# Patient Record
Sex: Male | Born: 1985 | Race: White | Hispanic: No | Marital: Single | Smoking: Never smoker
Health system: Southern US, Community
[De-identification: ages and names within clinical notes are randomized; demographics above are authoritative.]

## PROBLEM LIST (undated history)

## (undated) DIAGNOSIS — F411 Generalized anxiety disorder: Secondary | ICD-10-CM

## (undated) DIAGNOSIS — B1920 Unspecified viral hepatitis C without hepatic coma: Secondary | ICD-10-CM

## (undated) DIAGNOSIS — F909 Attention-deficit hyperactivity disorder, unspecified type: Secondary | ICD-10-CM

## (undated) DIAGNOSIS — F1120 Opioid dependence, uncomplicated: Secondary | ICD-10-CM

## (undated) DIAGNOSIS — F1190 Opioid use, unspecified, uncomplicated: Secondary | ICD-10-CM

---

## 2009-09-22 ENCOUNTER — Emergency Department (HOSPITAL_COMMUNITY)
Admission: EM | Admit: 2009-09-22 | Discharge: 2009-09-22 | Payer: Self-pay | Source: Home / Self Care | Admitting: Emergency Medicine

## 2010-07-14 LAB — DIFFERENTIAL
Basophils Absolute: 0 10*3/uL (ref 0.0–0.1)
Eosinophils Absolute: 0.1 10*3/uL (ref 0.0–0.7)
Eosinophils Relative: 1 % (ref 0–5)
Lymphs Abs: 1 10*3/uL (ref 0.7–4.0)
Monocytes Absolute: 0.5 10*3/uL (ref 0.1–1.0)
Neutro Abs: 5.2 10*3/uL (ref 1.7–7.7)
Neutrophils Relative %: 76 % (ref 43–77)

## 2010-07-14 LAB — PROTIME-INR: INR: 1.01 (ref 0.00–1.49)

## 2010-07-14 LAB — POCT I-STAT, CHEM 8
Calcium, Ion: 1.12 mmol/L (ref 1.12–1.32)
Creatinine, Ser: 0.8 mg/dL (ref 0.4–1.5)
HCT: 40 % (ref 39.0–52.0)
Hemoglobin: 13.6 g/dL (ref 13.0–17.0)
Sodium: 140 mEq/L (ref 135–145)

## 2010-07-14 LAB — URINALYSIS, ROUTINE W REFLEX MICROSCOPIC
Bilirubin Urine: NEGATIVE
Specific Gravity, Urine: >1.046 — ABNORMAL HIGH (ref 1.005–1.030)
Urobilinogen, UA: 1 mg/dL (ref 0.0–1.0)

## 2010-07-14 LAB — SAMPLE TO BLOOD BANK

## 2010-07-14 LAB — RAPID URINE DRUG SCREEN, HOSP PERFORMED
Barbiturates: NOT DETECTED
Benzodiazepines: POSITIVE — AB

## 2010-07-14 LAB — CBC: WBC: 6.8 10*3/uL (ref 4.0–10.5)

## 2014-11-07 ENCOUNTER — Emergency Department (HOSPITAL_COMMUNITY)
Admission: EM | Admit: 2014-11-07 | Discharge: 2014-11-07 | Disposition: A | Discharge status: Home / Self Care | Payer: No Typology Code available for payment source | Attending: Emergency Medicine | Admitting: Emergency Medicine

## 2014-11-07 ENCOUNTER — Encounter (HOSPITAL_COMMUNITY): Payer: Self-pay | Admitting: Family Medicine

## 2014-11-07 DIAGNOSIS — Z96649 Presence of unspecified artificial hip joint: Secondary | ICD-10-CM | POA: Diagnosis not present

## 2014-11-07 DIAGNOSIS — Z760 Encounter for issue of repeat prescription: Secondary | ICD-10-CM | POA: Diagnosis not present

## 2014-11-07 MED ORDER — BUPRENORPHINE HCL 8 MG SL SUBL
8.0000 mg | SUBLINGUAL_TABLET | Freq: Once | SUBLINGUAL | Status: AC
  Administered 2014-11-07: 8 mg via SUBLINGUAL
  Filled 2014-11-07: qty 1

## 2014-11-07 NOTE — ED Notes (Signed)
Pt here for daily dose of sobaxin. sts he went to clinic and they were closed early when he went today and they open back up tomorrow.

## 2014-11-07 NOTE — Discharge Instructions (Signed)
Please follow up with your clinic tomorrow.

## 2014-11-07 NOTE — ED Provider Notes (Signed)
CSN: [id number]     Arrival date & time 11/07/14  1156 History  This chart was scribed for non-physician practitioner, Lottie Musselatyana A Paulena Servais, PA-C, working with Purvis SheffieldForrest Harrison, MD by Charline BillsEssence Howell, ED Scribe. This patient was seen in room TR05C/TR05C and the patient's care was started at 1:02 PM.   Chief Complaint  Patient presents with  . med refill    The history is provided by the patient. No language interpreter was used.   HPI Comments: Angel Scott is a 29 y.o. male who presents to the Emergency Department for a medication refill of Suboxone. Pt states that he went to the Methadone clinic today for a refill but was informed that the clinic closed early for a training. He also states that his counselor is out for 6 weeks due to a hip replacement so he was unable to get a prescription. Pt was referred to the ED by the director of the clinic for a refill. Pt states that he goes to the clinic weekly to have medication filled for a week and has not missed a dose in years. His last dose was yesterday. Pt plans to return to the clinic tomorrow.   History reviewed. No pertinent past medical history. History reviewed. No pertinent past surgical history. History reviewed. No pertinent family history. History  Substance Use Topics  . Smoking status: Never Smoker   . Smokeless tobacco: Not on file  . Alcohol Use: No    Review of Systems  All other systems reviewed and are negative.  Allergies  Review of patient's allergies indicates no known allergies.  Home Medications   Prior to Admission medications   Not on File   BP 116/74 mmHg  Pulse 83  Temp(Src) 98 F (36.7 C)  Resp 18  Ht 6\' 2"  (1.88 m)  Wt 180 lb (81.647 kg)  BMI 23.10 kg/m2  SpO2 100% Physical Exam  Constitutional: He is oriented to person, place, and time. He appears well-developed and well-nourished. No distress.  HENT:  Head: Normocephalic and atraumatic.  Eyes: Conjunctivae and EOM are normal.  Neck: Neck  supple. No tracheal deviation present.  Cardiovascular: Normal rate.   Pulmonary/Chest: Effort normal. No respiratory distress.  Musculoskeletal: Normal range of motion.  Neurological: He is alert and oriented to person, place, and time.  Skin: Skin is warm and dry.  Psychiatric: He has a normal mood and affect. His behavior is normal.  Nursing note and vitals reviewed.  ED Course  Procedures (including critical care time) DIAGNOSTIC STUDIES: Oxygen Saturation is 100% on RA, normal by my interpretation.    COORDINATION OF CARE: 1:05 PM-Discussed treatment plan which includes suboxone with pt at bedside and pt agreed to plan.   Labs Review Labs Reviewed - No data to display  Imaging Review No results found.   EKG Interpretation None      MDM   Final diagnoses:  Medication refill    Pt with no complaints, requesting does of Suboxone. Pt will follow up tomorrow morning. 8mg  given in ED. Pt discharged home in stable condition.   Filed Vitals:   11/07/14 1217 11/07/14 1407  BP: 116/74 121/85  Pulse: 83 77  Temp: 98 F (36.7 C) 98.3 F (36.8 C)  TempSrc:  Oral  Resp: 18 18  Height: 6\' 2"  (1.88 m)   Weight: 180 lb (81.647 kg)   SpO2: 100% 100%    I personally performed the services described in this documentation, which was scribed in my presence. The recorded  information has been reviewed and is accurate.   Jaynie Crumble, PA-C 11/07/14 1657  Purvis Sheffield, MD 11/09/14 [phone]

## 2014-11-07 NOTE — ED Notes (Signed)
States he is here for medication refill from the Methadone clinic because his counselor is out with hip surgery. Security at the clinic stated he should come to the ED and get medication filled. Pt reports he goes once a week to have medication filled.

## 2015-08-12 ENCOUNTER — Other Ambulatory Visit: Payer: No Typology Code available for payment source

## 2015-08-12 DIAGNOSIS — B182 Chronic viral hepatitis C: Secondary | ICD-10-CM

## 2015-08-12 LAB — CBC WITH DIFFERENTIAL/PLATELET
BASOS PCT: 0 %
Basophils Absolute: 0 cells/uL (ref 0–200)
EOS PCT: 2 %
Eosinophils Absolute: 82 cells/uL (ref 15–500)
HCT: 40 % (ref 38.5–50.0)
Hemoglobin: 13.8 g/dL (ref 13.2–17.1)
LYMPHS ABS: 1066 {cells}/uL (ref 850–3900)
LYMPHS PCT: 26 %
MCH: 32.1 pg (ref 27.0–33.0)
MCHC: 34.5 g/dL (ref 32.0–36.0)
MCV: 93 fL (ref 80.0–100.0)
MONO ABS: 328 {cells}/uL (ref 200–950)
MPV: 10.3 fL (ref 7.5–12.5)
Monocytes Relative: 8 %
NEUTROS PCT: 64 %
Neutro Abs: 2624 cells/uL (ref 1500–7800)
Platelets: 271 10*3/uL (ref 140–400)
RBC: 4.3 MIL/uL (ref 4.20–5.80)
RDW: 13.4 % (ref 11.0–15.0)
WBC: 4.1 10*3/uL (ref 3.8–10.8)

## 2015-08-12 LAB — COMPREHENSIVE METABOLIC PANEL
ALK PHOS: 58 U/L (ref 40–115)
ALT: 55 U/L — AB (ref 9–46)
AST: 45 U/L — AB (ref 10–40)
Albumin: 4 g/dL (ref 3.6–5.1)
BILIRUBIN TOTAL: 0.8 mg/dL (ref 0.2–1.2)
BUN: 8 mg/dL (ref 7–25)
CO2: 26 mmol/L (ref 20–31)
CREATININE: 0.75 mg/dL (ref 0.60–1.35)
Calcium: 9.1 mg/dL (ref 8.6–10.3)
Chloride: 102 mmol/L (ref 98–110)
GLUCOSE: 96 mg/dL (ref 65–99)
Potassium: 4 mmol/L (ref 3.5–5.3)
SODIUM: 139 mmol/L (ref 135–146)
Total Protein: 7.4 g/dL (ref 6.1–8.1)

## 2015-08-12 LAB — PROTIME-INR
INR: 1.04 (ref <1.50)
Prothrombin Time: 13.7 seconds (ref 11.6–15.2)

## 2015-08-12 LAB — HIV ANTIBODY (ROUTINE TESTING W REFLEX): HIV 1&2 Ab, 4th Generation: NONREACTIVE

## 2015-08-12 LAB — HEPATITIS B CORE ANTIBODY, TOTAL: Hep B Core Total Ab: NONREACTIVE

## 2015-08-12 LAB — IRON: Iron: 269 ug/dL — ABNORMAL HIGH (ref 50–180)

## 2015-08-12 LAB — HEPATITIS A ANTIBODY, TOTAL: Hep A Total Ab: NONREACTIVE

## 2015-08-12 LAB — HEPATITIS B SURFACE ANTIBODY,QUALITATIVE: HEP B S AB: POSITIVE — AB

## 2015-08-12 LAB — HEPATITIS B SURFACE ANTIGEN: Hepatitis B Surface Ag: NEGATIVE

## 2015-08-13 LAB — ANA: ANA: NEGATIVE

## 2015-08-18 LAB — HCV RNA,LIPA RFLX NS5A DRUG RESIST

## 2015-08-18 LAB — HCV RNA, QUANT REAL-TIME PCR W/REFLEX
HCV RNA, PCR, QN (Log): 5.23 LogIU/mL — ABNORMAL HIGH
HCV RNA, PCR, QN: 169000 IU/mL — ABNORMAL HIGH

## 2015-09-11 ENCOUNTER — Ambulatory Visit (INDEPENDENT_AMBULATORY_CARE_PROVIDER_SITE_OTHER): Payer: BLUE CROSS/BLUE SHIELD | Admitting: Internal Medicine

## 2015-09-11 ENCOUNTER — Encounter: Payer: Self-pay | Admitting: Internal Medicine

## 2015-09-11 VITALS — BP 108/71 | HR 96 | Temp 98.3°F | Wt 184.0 lb

## 2015-09-11 DIAGNOSIS — B182 Chronic viral hepatitis C: Secondary | ICD-10-CM | POA: Diagnosis not present

## 2015-09-11 DIAGNOSIS — F411 Generalized anxiety disorder: Secondary | ICD-10-CM

## 2015-09-11 DIAGNOSIS — F329 Major depressive disorder, single episode, unspecified: Secondary | ICD-10-CM

## 2015-09-11 DIAGNOSIS — F909 Attention-deficit hyperactivity disorder, unspecified type: Secondary | ICD-10-CM | POA: Diagnosis not present

## 2015-09-11 DIAGNOSIS — F32A Depression, unspecified: Secondary | ICD-10-CM

## 2015-09-11 MED ORDER — SOFOSBUVIR-VELPATASVIR 400-100 MG PO TABS: 1.0000 | ORAL_TABLET | Freq: Every day | ORAL | Status: DC

## 2015-09-11 NOTE — Patient Instructions (Signed)
Date 09/11/2015  Dear Mr. Angel Scott, As discussed in the ID Clinic, your hepatitis C therapy will include the following medications:          Epclusa (sofosbuvir 400 mg/velpatasvir 100 mg) tablet:           Take 1 tablet by mouth once daily   Please note that ALL MEDICATIONS WILL START ON THE SAME DATE for a total of 12 weeks. ---------------------------------------------------------------- Your HCV Treatment Start Date: TBA   Your HCV genotype:  3    Liver Fibrosis: TBD    ---------------------------------------------------------------- YOUR PHARMACY CONTACT:   [facility] [address] Phone: [phone] Hours: Monday to Friday 7:30 am to 6:00 pm   Please always contact your pharmacy at least 3-4 business days before you run out of medications to ensure your next month's medication is ready or 1 week prior to running out if you receive it by mail.  Remember, each prescription is for 28 days. ---------------------------------------------------------------- GENERAL NOTES REGARDING YOUR HEPATITIS C MEDICATION:  SOFOSBUVIR/VELPATASVIR (Epclusa): - can be taken with our without food  - Acid reducing agents such as H2 blockers (ie. Pepcid (famotidine), Zantac (ranitidine), Tagamet (cimetidine), Axid (nizatidine) and proton pump inhibitors (ie. Prilosec (omeprazole), Protonix (pantoprazole), Nexium (esomeprazole), or Aciphex (rabeprazole)) can decrease effectiveness of Epclusa. Do not take until you have discussed with a health care provider.    -Antacids that contain magnesium and/or aluminum hydroxide (ie. Milk of Magensia, Rolaids, Gaviscon, Maalox, Mylanta, an dArthritis Pain Formula)can reduce absorption of Epclusa, so take them at least 4 hours after Epclusa.  -Calcium carbonate (calcium supplements or antacids such as Tums, Caltrate, Os-Cal)needs to be taken at least 4 hours hours after Epclusa.  -St. John's wort  or any products that contain St. John's wort like some herbal supplements  Please inform the office prior to starting any of these medications.  - The common side effects associated with Epclusa include:      1. Fatigue      2. Headache      3. Nausea      4. Diarrhea      5. Insomnia  Please note that this only lists the most common side effects and is NOT a comprehensive list of the potential side effects of these medications. For more information, please review the drug information sheets that come with your medication package from the pharmacy.  ---------------------------------------------------------------- GENERAL HELPFUL HINTS ON HCV THERAPY: 1. Stay well-hydrated. 2. Notify the ID Clinic of any changes in your other over-the-counter/herbal or prescription medications. 3. If you miss a dose of your medication, take the missed dose as soon as you remember. Return to your regular time/dose schedule the next day.  4.  Do not stop taking your medications without first talking with your healthcare provider. 5.  You may take Tylenol (acetaminophen), as long as the dose is less than 2000 mg (OR no more than 4 tablets of the Tylenol Extra Strengths 500mg  tablet) in 24 hours. 6.  You will see our pharmacist-specialist within the first 2 weeks of starting your medication. 7.  You will need to obtain routine labs around week 4 and12 weeks after starting and then 3 to 6 months after finishing Epclusa.    Staci RighterOMER, Kendell Sagraves, MD  [facility] [address] [place], [place]  [id number] [phone]

## 2015-09-13 DIAGNOSIS — F329 Major depressive disorder, single episode, unspecified: Secondary | ICD-10-CM | POA: Insufficient documentation

## 2015-09-13 DIAGNOSIS — F32A Depression, unspecified: Secondary | ICD-10-CM | POA: Insufficient documentation

## 2015-09-13 DIAGNOSIS — F411 Generalized anxiety disorder: Secondary | ICD-10-CM | POA: Insufficient documentation

## 2015-09-13 DIAGNOSIS — F909 Attention-deficit hyperactivity disorder, unspecified type: Secondary | ICD-10-CM | POA: Insufficient documentation

## 2015-09-13 NOTE — Progress Notes (Signed)
[facility]   CC: consideration for treatment for chronic hepatitis C  HPI:  +Angel Scott is a 30 y.o. male who presents for initial evaluation and management of chronic hepatitis C.  Patient tested positive over 5 years ago in [place], but did not ever follow through or was ever offered treatment. Hepatitis C-associated risk factors present are: history of drug abuse. Patient denies IVDU. Patient has had other studies performed. Results: hepatitis C RNA by PCR, result: positive. Patient has not had prior treatment for Hepatitis C. Patient does not have a past history of liver disease. Patient does not have a family history of liver disease. Patient does not  have associated signs or symptoms related to liver disease.  Labs reviewed and confirm chronic hepatitis C with a positive viral load.   Never previously treated, seems he was just told to wait as many providers were at the time waiting for the now available oral options.     Patient does not have documented immunity to Hepatitis A. Patient does have documented immunity to Hepatitis B.    Review of Systems:   Constitutional: negative for fatigue and malaise Gastrointestinal: negative for diarrhea Musculoskeletal: negative for myalgias and arthralgias All other systems reviewed and are negative      No past medical history on file.  Prior to Admission medications   Medication Sig Start Date End Date Taking? Authorizing Provider  amphetamine-dextroamphetamine (ADDERALL) 20 MG tablet Take 40 mg by mouth daily.   Yes Historical Provider, MD  Sofosbuvir-Velpatasvir (EPCLUSA) 400-100 MG TABS Take 1 tablet by mouth daily. 09/11/15   Gardiner Barefoot, MD    No Known Allergies  Social History  Substance Use Topics  . Smoking status: Never Smoker   . Smokeless tobacco: Never Used  . Alcohol Use: No    FMHx: possible aunt with cirrhosis   Objective:  Constitutional: in no apparent distress and alert,    Filed Vitals:   09/11/15 1458  BP: 108/71  Pulse: 96  Temp: 98.3 F (36.8 C)   Eyes: anicteric Cardiovascular: Cor RRR Respiratory: CTA B; normal respiratory effort Gastrointestinal: Bowel sounds are normal, liver is not enlarged, spleen is not enlarged Musculoskeletal: no pedal edema noted Skin: negatives: no rash; no porphyria cutanea tarda Lymphatic: no cervical lymphadenopathy   Laboratory Genotype: No results found for: HCVGENOTYPE HCV viral load: No results found for: HCVQUANT Lab Results  Component Value Date   WBC 4.1 08/12/2015   HGB 13.8 08/12/2015   HCT 40.0 08/12/2015   MCV 93.0 08/12/2015   PLT 271 08/12/2015    Lab Results  Component Value Date   CREATININE 0.75 08/12/2015   BUN 8 08/12/2015   NA 139 08/12/2015   K 4.0 08/12/2015   CL 102 08/12/2015   CO2 26 08/12/2015    Lab Results  Component Value Date   ALT 55* 08/12/2015   AST 45* 08/12/2015   ALKPHOS 58 08/12/2015     Labs and history reviewed and show CHILD-PUGH A  5-6 points: Child class A 7-9 points: Child class B 10-15 points: Child class C  Lab Results  Component Value Date   INR 1.04 08/12/2015   BILITOT 0.8 08/12/2015   ALBUMIN 4.0 08/12/2015     Assessment: New Patient with Chronic Hepatitis C genotype 3a, untreated.  I discussed with the patient the lab findings that confirm chronic hepatitis C as well as the natural history and progression of disease including about 30% of  people who develop cirrhosis of the liver if left untreated and once cirrhosis is established there is a 2-7% risk per year of liver cancer and liver failure.  I discussed the importance of treatment and benefits in reducing the risk, even if significant liver fibrosis exists.  Also with resultant transaminitis.   Plan: 1) Patient counseled extensively on limiting acetaminophen to no more than 2 grams daily, avoidance of alcohol. 2) Transmission discussed with patient including sexual transmission,  sharing razors and toothbrush.   3) Will need referral to gastroenterology if concern for cirrhosis 4) Will need referral for substance abuse counseling: No.; Further work up to include urine drug screen  No. 5) Will prescribe Epclusa for 12 weeks 6) Hepatitis A vaccine Yes.  will offer next visit.   7) Hepatitis B vaccine No. 8) Pneumovax vaccine if concern for cirrhosis 9) Further work up to include liver staging with elastography 10) will follow up after starting medication

## 2015-09-25 ENCOUNTER — Ambulatory Visit (HOSPITAL_COMMUNITY): Payer: BLUE CROSS/BLUE SHIELD

## 2015-10-22 ENCOUNTER — Ambulatory Visit (HOSPITAL_COMMUNITY): Payer: BLUE CROSS/BLUE SHIELD

## 2015-10-31 ENCOUNTER — Ambulatory Visit (HOSPITAL_COMMUNITY): Payer: BLUE CROSS/BLUE SHIELD

## 2015-11-20 ENCOUNTER — Ambulatory Visit (HOSPITAL_COMMUNITY)
Admission: RE | Admit: 2015-11-20 | Discharge: 2015-11-20 | Disposition: A | Discharge status: Home / Self Care | Payer: BLUE CROSS/BLUE SHIELD | Source: Ambulatory Visit | Attending: Internal Medicine | Admitting: Internal Medicine

## 2015-11-20 DIAGNOSIS — B182 Chronic viral hepatitis C: Secondary | ICD-10-CM

## 2015-12-12 ENCOUNTER — Ambulatory Visit (HOSPITAL_COMMUNITY): Payer: BLUE CROSS/BLUE SHIELD

## 2016-02-11 ENCOUNTER — Ambulatory Visit (HOSPITAL_COMMUNITY)
Admission: RE | Admit: 2016-02-11 | Discharge: 2016-02-11 | Disposition: A | Discharge status: Home / Self Care | Payer: BLUE CROSS/BLUE SHIELD | Source: Ambulatory Visit | Attending: Internal Medicine | Admitting: Internal Medicine

## 2016-02-11 DIAGNOSIS — B182 Chronic viral hepatitis C: Secondary | ICD-10-CM | POA: Insufficient documentation

## 2016-02-12 ENCOUNTER — Other Ambulatory Visit: Payer: Self-pay | Admitting: Pharmacist

## 2016-02-12 MED ORDER — SOFOSBUVIR-VELPATASVIR 400-100 MG PO TABS: 1.0000 | ORAL_TABLET | Freq: Every day | ORAL | 2 refills | Status: DC

## 2016-02-24 ENCOUNTER — Encounter: Payer: Self-pay | Admitting: Pharmacy Technician

## 2016-03-17 ENCOUNTER — Ambulatory Visit: Payer: BLUE CROSS/BLUE SHIELD

## 2016-06-02 ENCOUNTER — Ambulatory Visit (INDEPENDENT_AMBULATORY_CARE_PROVIDER_SITE_OTHER): Payer: BLUE CROSS/BLUE SHIELD | Admitting: Pharmacist Clinician (PhC)/ Clinical Pharmacy Specialist

## 2016-06-02 DIAGNOSIS — B182 Chronic viral hepatitis C: Secondary | ICD-10-CM

## 2016-06-02 NOTE — Progress Notes (Signed)
HPI: Angel Scott is a 31 y.o. male who has never f/u with us after starting his hep C meds.   No results found for: HCVGENOTYPE, HEPCGENOTYPE  Allergies: No Known Allergies  Vitals:    Past Medical History: No past medical history on file.  Social History: Social History   Social History  . Marital status: Single    Spouse name: N/A  . Number of children: N/A  . Years of education: N/A   Social History Main Topics  . Smoking status: Never Smoker  . Smokeless tobacco: Never Used  . Alcohol use No  . Drug use: No  . Sexual activity: Not on file   Other Topics Concern  . Not on file   Social History Narrative  . No narrative on file    Labs: Hep B S Ab (no units)  Date Value  08/12/2015 POS (A)   Hepatitis B Surface Ag (no units)  Date Value  08/12/2015 NEGATIVE    No results found for: HCVGENOTYPE, HEPCGENOTYPE  No flowsheet data found.  AST (U/L)  Date Value  08/12/2015 45 (H)   ALT (U/L)  Date Value  08/12/2015 55 (H)   INR (no units)  Date Value  08/12/2015 1.04  09/22/2009 1.01    CrCl: CrCl cannot be calculated (Patient's most recent lab result is older than the maximum 21 days allowed.).  Fibrosis Score: F2/3 as assessed by [facility]   Child-Pugh Score: Class A  Previous Treatment Regimen: None  Assessment: Angel Scott started his Epclusa on 10/31 for this geno 3 hepatitis C. Not sure why he fell through the loop. He hasn't had any side effects from the drug. He only took one dose late on the day that his daughter was born. He finished about 1.5 weeks ago. Therefore, we are going to do his EOT VL today. Since he is not cirrhotic, Rx will bring him back for the cure visit. He is currently on subutex for the opiate problem.   Recommendations: EOT VL today SVR12 in 3 months Appt are made to f/u with pharmacy  [facility] LL[facility] Lizette Pazos, Pharm.D., BCPS, AAHIVP Clinical Infectious Disease Pharmacist [facility] 06/02/2016, 4:04  PM

## 2016-06-02 NOTE — Patient Instructions (Addendum)
F/u with lab in 3 mo F/u with me a week later for the cure visit

## 2016-06-04 ENCOUNTER — Other Ambulatory Visit: Payer: BLUE CROSS/BLUE SHIELD

## 2016-06-04 LAB — COMPLETE METABOLIC PANEL WITH GFR
ALBUMIN: 4.7 g/dL (ref 3.6–5.1)
ALK PHOS: 73 U/L (ref 40–115)
ALT: 17 U/L (ref 9–46)
AST: 24 U/L (ref 10–40)
BILIRUBIN TOTAL: 0.5 mg/dL (ref 0.2–1.2)
BUN: 10 mg/dL (ref 7–25)
CO2: 27 mmol/L (ref 20–31)
CREATININE: 0.87 mg/dL (ref 0.60–1.35)
Calcium: 9.8 mg/dL (ref 8.6–10.3)
Chloride: 99 mmol/L (ref 98–110)
GFR, Est African American: >89 mL/min (ref >=60)
GFR, Est Non African American: >89 mL/min (ref >=60)
GLUCOSE: 128 mg/dL — AB (ref 65–99)
Potassium: 3.8 mmol/L (ref 3.5–5.3)
SODIUM: 140 mmol/L (ref 135–146)
TOTAL PROTEIN: 8.8 g/dL — AB (ref 6.1–8.1)

## 2016-06-08 LAB — HEPATITIS C RNA QUANTITATIVE
HCV Quantitative Log: <1.18 Log IU/mL
HCV Quantitative: <15 IU/mL

## 2016-06-10 ENCOUNTER — Telehealth: Payer: Self-pay | Admitting: Pharmacist Clinician (PhC)/ Clinical Pharmacy Specialist

## 2016-06-10 NOTE — Telephone Encounter (Signed)
Angel Scott had a visit with me recently where we did a hep VL for his EOT. Told him that it has came back undetectable so far which is good news. [id number] then see me.

## 2016-08-25 ENCOUNTER — Other Ambulatory Visit: Payer: BLUE CROSS/BLUE SHIELD

## 2016-08-25 DIAGNOSIS — B182 Chronic viral hepatitis C: Secondary | ICD-10-CM

## 2016-08-27 LAB — HEPATITIS C RNA QUANTITATIVE
HCV Quantitative Log: <1.18 Log IU/mL
HCV Quantitative: <15 IU/mL

## 2016-08-29 ENCOUNTER — Encounter: Payer: Self-pay | Admitting: Internal Medicine

## 2016-08-31 ENCOUNTER — Ambulatory Visit: Payer: BLUE CROSS/BLUE SHIELD

## 2016-09-01 ENCOUNTER — Encounter: Payer: Self-pay | Admitting: Pharmacist Clinician (PhC)/ Clinical Pharmacy Specialist

## 2018-03-14 ENCOUNTER — Other Ambulatory Visit: Payer: Self-pay

## 2018-03-14 ENCOUNTER — Emergency Department (HOSPITAL_COMMUNITY): Payer: BLUE CROSS/BLUE SHIELD

## 2018-03-14 ENCOUNTER — Encounter (HOSPITAL_COMMUNITY): Payer: Self-pay | Admitting: *Deleted

## 2018-03-14 ENCOUNTER — Emergency Department (HOSPITAL_COMMUNITY)
Admission: EM | Admit: 2018-03-14 | Discharge: 2018-03-14 | Disposition: A | Discharge status: Home / Self Care | Payer: BLUE CROSS/BLUE SHIELD | Attending: Emergency Medicine | Admitting: Emergency Medicine

## 2018-03-14 DIAGNOSIS — S63287A Dislocation of proximal interphalangeal joint of left little finger, initial encounter: Secondary | ICD-10-CM | POA: Insufficient documentation

## 2018-03-14 DIAGNOSIS — Y999 Unspecified external cause status: Secondary | ICD-10-CM | POA: Diagnosis not present

## 2018-03-14 DIAGNOSIS — Z79899 Other long term (current) drug therapy: Secondary | ICD-10-CM | POA: Insufficient documentation

## 2018-03-14 DIAGNOSIS — F909 Attention-deficit hyperactivity disorder, unspecified type: Secondary | ICD-10-CM | POA: Insufficient documentation

## 2018-03-14 DIAGNOSIS — W231XXA Caught, crushed, jammed, or pinched between stationary objects, initial encounter: Secondary | ICD-10-CM | POA: Diagnosis not present

## 2018-03-14 DIAGNOSIS — S62609A Fracture of unspecified phalanx of unspecified finger, initial encounter for closed fracture: Secondary | ICD-10-CM

## 2018-03-14 DIAGNOSIS — Y929 Unspecified place or not applicable: Secondary | ICD-10-CM | POA: Diagnosis not present

## 2018-03-14 DIAGNOSIS — S6992XA Unspecified injury of left wrist, hand and finger(s), initial encounter: Secondary | ICD-10-CM | POA: Diagnosis present

## 2018-03-14 DIAGNOSIS — Y9389 Activity, other specified: Secondary | ICD-10-CM | POA: Diagnosis not present

## 2018-03-14 MED ORDER — LIDOCAINE HCL (PF) 1 % IJ SOLN
30.0000 mL | Freq: Once | INTRAMUSCULAR | Status: AC
  Administered 2018-03-14: 30 mL
  Filled 2018-03-14: qty 30

## 2018-03-14 NOTE — ED Triage Notes (Signed)
The pt has injured his lt little finger while moving earlier tonight  Painful and sl swollen

## 2018-03-14 NOTE — ED Provider Notes (Signed)
[facility] Provider Note   CSN: [id number] Arrival date & time: 03/14/18  0125     History   Chief Complaint Chief Complaint  Patient presents with  . Finger Injury    HPI Angel Scott is a 32 y.o. male with history of a.  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  Sudden onset immediately PTA.  Patient states that he was moving around furniture when he excellently dropped a piece of furniture and got his pinky finger stuck in between 2 pieces of furniture.  He had sudden, severe pain in his left pinky finger with audible pop.  Has applied ice.  No other interventions.  Pain is aggravated with any movement or palpation.  States his finger feels stuck and he cannot really flex it.  No distal paresthesias or loss of sensation.  He is right-hand dominant.  HPI  History reviewed. No pertinent past medical history.  Patient Active Problem List   Diagnosis Date Noted  . Attention deficit hyperactivity disorder (ADHD) 09/13/2015  . Depression 09/13/2015  . Generalized anxiety disorder 09/13/2015  . Chronic hepatitis C without hepatic coma (HCC) 09/11/2015    History reviewed. No pertinent surgical history.      Home Medications    Prior to Admission medications   Medication Sig Start Date End Date Taking? Authorizing Provider  amphetamine-dextroamphetamine (ADDERALL) 20 MG tablet Take 20 mg by mouth 2 (two) times daily.     [provider]  buprenorphine (SUBUTEX) 2 MG SUBL SL tablet Place 8 mg under the tongue daily.    [provider]  clonazePAM (KLONOPIN) 0.5 MG tablet Take 2 mg by mouth 2 (two) times daily as needed for anxiety.    [provider]  Sofosbuvir-Velpatasvir (EPCLUSA) 400-100 MG TABS Take 1 tablet by mouth daily. Patient not taking: Reported on 06/02/2016 02/12/16   Gardiner Barefootomer, Robert W, MD    Family  History No family history on file.  Social History Social History   Tobacco Use  . Smoking status: Never Smoker  . Smokeless tobacco: Never Used  Substance Use Topics  . Alcohol use: No    Alcohol/week: 0.0 standard drinks  . Drug use: No     Allergies   Patient has no known allergies.   Review of Systems Review of Systems  Musculoskeletal: Positive for arthralgias and joint swelling.  All other systems reviewed and are negative.    Physical Exam Updated Vital Signs BP 110/75 (BP Location: Right Arm)   Pulse (!) 107   Temp 98.1 F (36.7 C) (Oral)   Resp 18   Ht 6\' 2"  (1.88 m)   Wt 81.6 kg   SpO2 100%   BMI 23.11 kg/m   Physical Exam  Constitutional: He is oriented to person, place, and time. He appears well-developed and well-nourished.  Non-toxic appearance.  HENT:  Head: Normocephalic.  Right Ear: External ear normal.  Left Ear: External ear normal.  Nose: Nose normal.  Eyes: Conjunctivae and EOM are normal.  Neck: Full passive range of motion without pain.  Cardiovascular: Normal rate.  Brisk cap refill to left finger tips. 1+ radial pulses bilaterally  Pulmonary/Chest: Effort normal. No tachypnea. No respiratory distress.  Musculoskeletal: Normal range of motion. He exhibits tenderness and deformity.  Obvious deformity at DIP of left 5th finger, hyperextension at DIP with mild edema. Focal tenderness. Full flexion at MCP but unable to F/E at  DIP or PIP.  Full painless movements of other digits, wrist.   Neurological: He is alert and oriented to person, place, and time.  Sensation to light touch and sharp prick intact in left 5th finger pad   Skin: Skin is warm and dry. Capillary refill takes less than 2 seconds.  Psychiatric: His behavior is normal. Thought content normal.     ED Treatments / Results  Labs (all labs ordered are listed, but only abnormal results are displayed) Labs Reviewed - No data to display  EKG None  Radiology Dg Finger  Little Left  Result Date: 03/14/2018 CLINICAL DATA:  Postreduction left fifth finger EXAM: LEFT LITTLE FINGER 2+V COMPARISON:  03/14/2018 FINDINGS: Interval relocation of the proximal interphalangeal joint with anatomic alignment postreduction. There again is a displaced volar plate fracture fragment. Diffuse soft tissue swelling. IMPRESSION: Anatomic alignment postreduction. Displaced volar plate fracture fragment off of the middle phalanx. Electronically Signed   By: Burman Nieves M.D.   On: 03/14/2018 02:58   Dg Finger Little Left  Result Date: 03/14/2018 CLINICAL DATA:  Pain and swelling of the left fifth finger after injury tonight. EXAM: LEFT LITTLE FINGER 2+V COMPARISON:  None. FINDINGS: There is complete posterior dislocation of the middle phalanx of the left fifth finger with respect to the proximal phalanx. There is a small volar plate fragment anteriorly consistent with displaced volar plate fracture and ligamentous injury. Distal interphalangeal joint appears intact. Soft tissue swelling. No focal bone lesion or bone destruction. IMPRESSION: Complete posterior dislocation of the middle phalanx of the left fifth finger with displaced volar plate fracture fragment. Electronically Signed   By: Burman Nieves M.D.   On: 03/14/2018 02:05    Procedures .Nerve Block Date/Time: 03/14/2018 3:04 AM Performed by: Liberty Handy, PA-C Authorized by: Liberty Handy, PA-C   Consent:    Consent obtained:  Verbal   Consent given by:  Patient   Risks discussed:  Infection, nerve damage, swelling, unsuccessful block, pain and bleeding   Alternatives discussed:  Alternative treatment (reduction of dislocation without anesthesia) Universal protocol:    Test results available and properly labeled: yes     Imaging studies available: yes     Patient identity confirmed:  Verbally with patient Indications:    Indications:  Pain relief and procedural anesthesia Pre-procedure details:     Skin preparation:  Alcohol Skin anesthesia (see MAR for exact dosages):    Skin anesthesia method:  Local infiltration   Local anesthetic:  Lidocaine 1% w/o epi Procedure details (see MAR for exact dosages):    Block needle gauge:  25 G   Anesthetic injected:  Lidocaine 1% w/o epi   Injection procedure:  Anatomic landmarks identified, incremental injection, negative aspiration for blood, introduced needle and anatomic landmarks palpated   Paresthesia:  None Post-procedure details:    Dressing:  None   Outcome:  Anesthesia achieved   Patient tolerance of procedure:  Tolerated well, no immediate complications  Reduction of dislocation Date/Time: 03/14/2018 3:11 AM Performed by: Liberty Handy, PA-C Authorized by: Liberty Handy, PA-C  Consent: Verbal consent obtained. Risks and benefits: risks, benefits and alternatives were discussed Consent given by: patient Patient understanding: patient states understanding of the procedure being performed Test results: test results available and properly labeled Imaging studies: imaging studies available Patient identity confirmed: verbally with patient Time out: Immediately prior to procedure a "time out" was called to verify the correct patient, procedure, equipment, support staff and site/side marked as required.  Local anesthesia used: yes Anesthesia: digital block  Anesthesia: Local anesthesia used: yes Local Anesthetic: lidocaine 1% without epinephrine  Sedation: Patient sedated: no  Patient tolerance: Patient tolerated the procedure well with no immediate complications Comments: Patient able to flex to 90 degrees at DIP and PIP after procedure. Brisk cap refill distally.     (including critical care time)  Medications Ordered in ED Medications  lidocaine (PF) (XYLOCAINE) 1 % injection 30 mL (30 mLs Infiltration Given by Other 03/14/18 0210)     Initial Impression / Assessment and Plan / ED Course  I have reviewed the  triage vital signs and the nursing notes.  Pertinent labs & imaging results that were available during my care of the patient were reviewed by me and considered in my medical decision making (see chart for details).  Clinical Course as of Mar 14 314  Mon Mar 14, 2018  0224 IMPRESSION: Complete posterior dislocation of the middle phalanx of the left fifth finger with displaced volar plate fracture fragment.  DG Finger Little Left [CG]    Clinical Course User Index [CG] Liberty Handy, PA-C   32 year old here with traumatic fracture dislocation of the left PIP.  Extremity neurovascularly intact prior to reduction.  Digital block and reduction performed in the ER without immediate complications.  Post reduction x-rays confirm anatomical position and previously noted volar plate fracture fragment.  Patient was placed in a splint.  We will discharge with rice protocol, follow-up with hand.  Return precautions given.  Patient able to flex at DIP/PIP to 90 degrees after reduction.   Final Clinical Impressions(s) / ED Diagnoses   Final diagnoses:  Closed fracture dislocation of proximal interphalangeal (PIP) joint of finger, initial encounter    ED Discharge Orders    None       Liberty Handy, PA-C 03/14/18 0315    Gilda Crease, MD 03/16/18 [phone]

## 2018-03-14 NOTE — Discharge Instructions (Signed)
You were seen in the ER for left pinky pain.  You have a fracture dislocation injury.  Dislocation was reduced.  Wear your splint until you are reevaluated by hand surgery.  Follow-up within 7 to 10 days for repeat exam and further management.  Can take ibuprofen or acetaminophen for pain.  Ice.  Return for loss of sensation of the fingertip, purple discoloration or coolness to the finger.

## 2018-03-18 ENCOUNTER — Encounter (HOSPITAL_COMMUNITY): Payer: Self-pay

## 2018-03-18 ENCOUNTER — Emergency Department (HOSPITAL_COMMUNITY)
Admission: EM | Admit: 2018-03-18 | Discharge: 2018-03-18 | Disposition: A | Discharge status: Home / Self Care | Payer: BLUE CROSS/BLUE SHIELD | Attending: Emergency Medicine | Admitting: Emergency Medicine

## 2018-03-18 DIAGNOSIS — K0889 Other specified disorders of teeth and supporting structures: Secondary | ICD-10-CM | POA: Diagnosis present

## 2018-03-18 DIAGNOSIS — Z79899 Other long term (current) drug therapy: Secondary | ICD-10-CM | POA: Insufficient documentation

## 2018-03-18 MED ORDER — PENICILLIN V POTASSIUM 500 MG PO TABS: 500.0000 mg | ORAL_TABLET | Freq: Four times a day (QID) | ORAL | 0 refills | Status: DC

## 2018-03-18 MED ORDER — PENICILLIN V POTASSIUM 250 MG PO TABS
500.0000 mg | ORAL_TABLET | Freq: Once | ORAL | Status: AC
  Administered 2018-03-18: 500 mg via ORAL
  Filled 2018-03-18: qty 2

## 2018-03-18 NOTE — ED Provider Notes (Signed)
[facility] EMERGENCY DEPARTMENT Provider Note   CSN: [id number] Arrival date & time: 03/18/18  0058     History   Chief Complaint No chief complaint on file.   HPI Angel Scott is a 32 y.o. male.  Patient presents to the emergency department with a dental complaint. Symptoms began a few days ago. The patient has tried to alleviate pain with benzocaine.  Pain rated at a 10/10, characterized as throbbing in nature and located left lower 2nd to rear molar. Patient denies fever, night sweats, chills, difficulty swallowing or opening mouth, SOB, nuchal rigidity or decreased ROM of neck.  Patient does not have a dentist and requests a resource guide at discharge.   The history is provided by the patient. No language interpreter was used.    History reviewed. No pertinent past medical history.  Patient Active Problem List   Diagnosis Date Noted  . Attention deficit hyperactivity disorder (ADHD) 09/13/2015  . Depression 09/13/2015  . Generalized anxiety disorder 09/13/2015  . Chronic hepatitis C without hepatic coma (HCC) 09/11/2015    History reviewed. No pertinent surgical history.      Home Medications    Prior to Admission medications   Medication Sig Start Date End Date Taking? Authorizing Provider  amphetamine-dextroamphetamine (ADDERALL) 20 MG tablet Take 20 mg by mouth 2 (two) times daily.     [provider]  buprenorphine (SUBUTEX) 2 MG SUBL SL tablet Place 8 mg under the tongue daily.    [provider]  clonazePAM (KLONOPIN) 0.5 MG tablet Take 2 mg by mouth 2 (two) times daily as needed for anxiety.    [provider]  Sofosbuvir-Velpatasvir (EPCLUSA) 400-100 MG TABS Take 1 tablet by mouth daily. Patient not taking: Reported on 06/02/2016 02/12/16   Gardiner Barefoot, MD    Family History History reviewed. No pertinent family history.  Social History Social History   Tobacco Use  . Smoking status: Never Smoker  .  Smokeless tobacco: Never Used  Substance Use Topics  . Alcohol use: No    Alcohol/week: 0.0 standard drinks  . Drug use: No     Allergies   Patient has no known allergies.   Review of Systems Review of Systems  Constitutional: Negative for chills and fever.  HENT: Positive for dental problem. Negative for drooling.   Neurological: Negative for speech difficulty.  Psychiatric/Behavioral: Positive for sleep disturbance.     Physical Exam Updated Vital Signs BP (!) 128/95   Pulse 87   Temp 98.7 F (37.1 C) (Oral)   Ht 6\' 2"  (1.88 m)   Wt 81.6 kg   SpO2 100%   BMI 23.11 kg/m   Physical Exam Physical Exam  Constitutional: Pt appears well-developed and well-nourished.  HENT:  Head: Normocephalic.  Right Ear: Tympanic membrane, external ear and ear canal normal.  Left Ear: Tympanic membrane, external ear and ear canal normal.  Nose: Nose normal. Right sinus exhibits no maxillary sinus tenderness and no frontal sinus tenderness. Left sinus exhibits no maxillary sinus tenderness and no frontal sinus tenderness.  Mouth/Throat: Uvula is midline, oropharynx is clear and moist and mucous membranes are normal. No oral lesions. No uvula swelling or lacerations. No oropharyngeal exudate, posterior oropharyngeal edema, posterior oropharyngeal erythema or tonsillar abscesses.  Poor dentition No gingival swelling, fluctuance or induration No gross abscess  No sublingual edema, tenderness to palpation, or sign of Ludwig's angina, or deep space infection Pain at cracked left lower rear molar Eyes: Conjunctivae are normal.  Pupils are equal, round, and reactive to light. Right eye exhibits no discharge. Left eye exhibits no discharge.  Neck: Normal range of motion. Neck supple.  No stridor Handling secretions without difficulty No nuchal rigidity No cervical lymphadenopathy Cardiovascular: Normal rate, regular rhythm and normal heart sounds.   Pulmonary/Chest: Effort normal. No  respiratory distress.  Equal chest rise  Abdominal: Soft. Bowel sounds are normal. Pt exhibits no distension. There is no tenderness.  Lymphadenopathy: Pt has no cervical adenopathy.  Neurological: Pt is alert and oriented x 4  Skin: Skin is warm and dry.  Psychiatric: Pt has a normal mood and affect.  Nursing note and vitals reviewed.    ED Treatments / Results  Labs (all labs ordered are listed, but only abnormal results are displayed) Labs Reviewed - No data to display  EKG None  Radiology No results found.  Procedures Procedures (including critical care time)  Medications Ordered in ED Medications  penicillin v potassium (VEETID) tablet 500 mg (has no administration in time range)     Initial Impression / Assessment and Plan / ED Course  I have reviewed the triage vital signs and the nursing notes.  Pertinent labs & imaging results that were available during my care of the patient were reviewed by me and considered in my medical decision making (see chart for details).     Patient with dentalgia.  No abscess requiring immediate incision and drainage.  Exam not concerning for Ludwig's angina or pharyngeal abscess.  Will treat with penicillin. Pt instructed to follow-up with dentist.  Discussed return precautions. Pt safe for discharge.   Final Clinical Impressions(s) / ED Diagnoses   Final diagnoses:  Pain, dental    ED Discharge Orders         Ordered    penicillin v potassium (VEETID) 500 MG tablet  4 times daily     03/18/18 0128           Roxy HorsemanBrowning, Leslieann Whisman, PA-C 03/18/18 0129    Ward, Layla MawKristen N, DO 03/18/18 0132

## 2018-03-18 NOTE — ED Triage Notes (Signed)
Pt coming with reports left bottom molar. Pt has some swelling to left cheek. Pt. A/ o x4

## 2020-06-09 IMAGING — DX DG FINGER LITTLE 2+V*L*
3 series · 3 of 3 positions shown · non-contrast
Comparison: 03/14/2018

CLINICAL DATA: Postreduction left fifth finger

EXAM:
LEFT LITTLE FINGER 2+V

[finger ap]
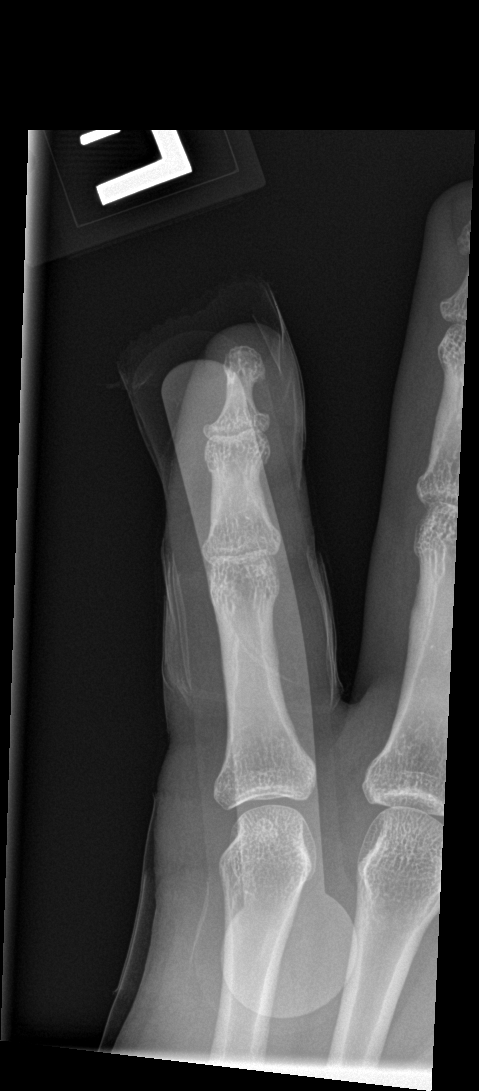

[finger obl]
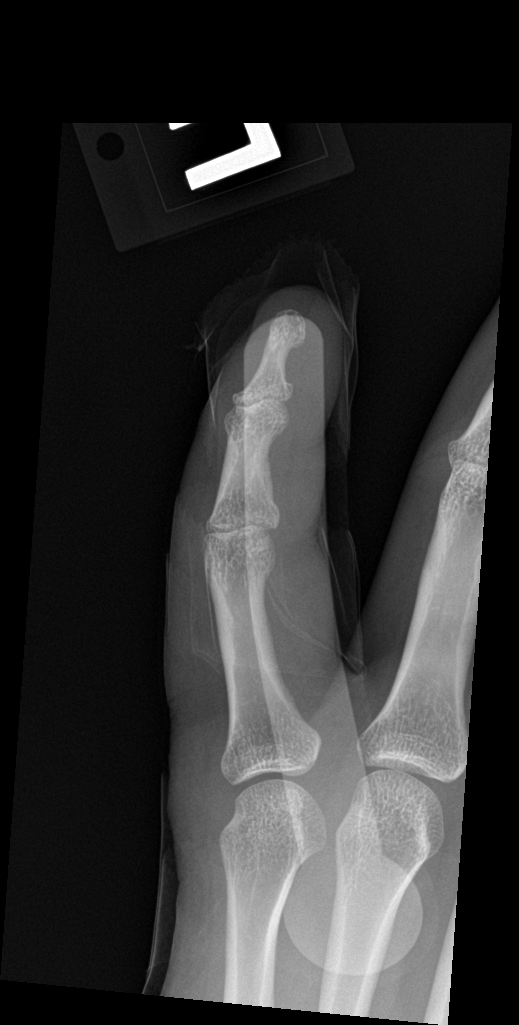

[finger lat]
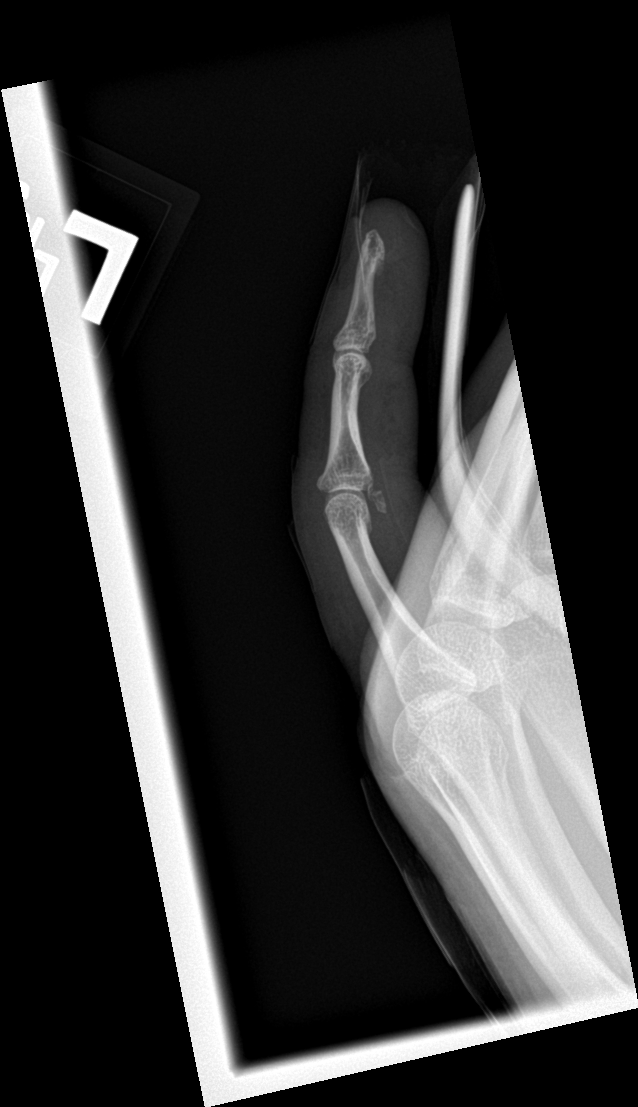

[3 of 3 positions shown; findings below may reference images not displayed]

FINDINGS: Interval relocation of the proximal interphalangeal joint with
anatomic alignment postreduction. There again is a displaced volar
plate fracture fragment. Diffuse soft tissue swelling.
IMPRESSION: Anatomic alignment postreduction. Displaced volar plate fracture
fragment off of the middle phalanx.

## 2020-10-31 ENCOUNTER — Emergency Department (HOSPITAL_COMMUNITY)
Admission: EM | Admit: 2020-10-31 | Discharge: 2020-10-31 | Discharge status: Against Medical Advice | Payer: Medicaid Other

## 2020-10-31 ENCOUNTER — Encounter (HOSPITAL_BASED_OUTPATIENT_CLINIC_OR_DEPARTMENT_OTHER): Payer: Self-pay | Admitting: Emergency Medicine

## 2020-10-31 ENCOUNTER — Other Ambulatory Visit: Payer: Self-pay

## 2020-10-31 DIAGNOSIS — S01511A Laceration without foreign body of lip, initial encounter: Secondary | ICD-10-CM | POA: Insufficient documentation

## 2020-10-31 DIAGNOSIS — S63621A Sprain of interphalangeal joint of right thumb, initial encounter: Secondary | ICD-10-CM | POA: Insufficient documentation

## 2020-10-31 DIAGNOSIS — S0512XA Contusion of eyeball and orbital tissues, left eye, initial encounter: Secondary | ICD-10-CM | POA: Insufficient documentation
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
## 2020-10-31 NOTE — ED Notes (Signed)
Called for triage with no respond 

## 2020-10-31 NOTE — ED Triage Notes (Signed)
Also c/o thumb pain, states thumb hurts more than lip lac

## 2020-10-31 NOTE — ED Notes (Signed)
Patient was advised to stay but would like to go to another facility

## 2020-11-01 ENCOUNTER — Encounter: Payer: Self-pay | Admitting: Emergency Medicine

## 2020-11-01 ENCOUNTER — Encounter (HOSPITAL_BASED_OUTPATIENT_CLINIC_OR_DEPARTMENT_OTHER): Payer: Self-pay | Admitting: Emergency Medicine

## 2020-11-01 ENCOUNTER — Emergency Department (HOSPITAL_BASED_OUTPATIENT_CLINIC_OR_DEPARTMENT_OTHER)
Admission: EM | Admit: 2020-11-01 | Discharge: 2020-11-01 | Disposition: A | Discharge status: Home / Self Care | Payer: Medicaid Other | Attending: Emergency Medicine | Admitting: Emergency Medicine

## 2020-11-01 DIAGNOSIS — F1190 Opioid use, unspecified, uncomplicated: Secondary | ICD-10-CM | POA: Insufficient documentation

## 2020-11-01 DIAGNOSIS — S63621A Sprain of interphalangeal joint of right thumb, initial encounter: Secondary | ICD-10-CM

## 2020-11-01 DIAGNOSIS — S0512XA Contusion of eyeball and orbital tissues, left eye, initial encounter: Secondary | ICD-10-CM

## 2020-11-01 DIAGNOSIS — F1120 Opioid dependence, uncomplicated: Secondary | ICD-10-CM | POA: Insufficient documentation

## 2020-11-01 DIAGNOSIS — S01511A Laceration without foreign body of lip, initial encounter: Secondary | ICD-10-CM

## 2020-11-01 HISTORY — DX: Generalized anxiety disorder: F41.1

## 2020-11-01 HISTORY — DX: Opioid use, unspecified, uncomplicated: F11.90

## 2020-11-01 HISTORY — DX: Unspecified viral hepatitis C without hepatic coma: B19.20

## 2020-11-01 HISTORY — DX: Attention-deficit hyperactivity disorder, unspecified type: F90.9

## 2020-11-01 HISTORY — DX: Opioid dependence, uncomplicated: F11.20

## 2020-11-01 MED ORDER — TETANUS-DIPHTH-ACELL PERTUSSIS 5-2.5-18.5 LF-MCG/0.5 IM SUSY
0.5000 mL | PREFILLED_SYRINGE | Freq: Once | INTRAMUSCULAR | Status: DC
  Filled 2020-11-01: qty 0.5

## 2020-11-01 MED ORDER — LIDOCAINE-EPINEPHRINE-TETRACAINE (LET) TOPICAL GEL
3.0000 mL | Freq: Once | TOPICAL | Status: AC
  Administered 2020-11-01: 3 mL via TOPICAL
  Filled 2020-11-01: qty 3
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
## 2020-11-01 NOTE — ED Notes (Signed)
Pt came to desk stating he was leaving  Discharge paperwork given at desk  Unable to obtain discharge vital signs or obtain signature
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
## 2023-11-13 ENCOUNTER — Other Ambulatory Visit: Payer: Self-pay

## 2023-11-13 ENCOUNTER — Inpatient Hospital Stay (HOSPITAL_COMMUNITY): Payer: Self-pay

## 2023-11-13 ENCOUNTER — Emergency Department (HOSPITAL_COMMUNITY): Payer: Self-pay

## 2023-11-13 ENCOUNTER — Encounter (HOSPITAL_COMMUNITY): Payer: Self-pay

## 2023-11-13 ENCOUNTER — Inpatient Hospital Stay (HOSPITAL_COMMUNITY)
Admission: EM | Admit: 2023-11-13 | Discharge: 2023-11-26 | DRG: 917 | Disposition: E | Payer: Self-pay | Attending: Pulmonary Disease | Admitting: Pulmonary Disease

## 2023-11-13 DIAGNOSIS — I468 Cardiac arrest due to other underlying condition: Secondary | ICD-10-CM | POA: Diagnosis present

## 2023-11-13 DIAGNOSIS — Z515 Encounter for palliative care: Secondary | ICD-10-CM | POA: Diagnosis not present

## 2023-11-13 DIAGNOSIS — G9340 Encephalopathy, unspecified: Secondary | ICD-10-CM

## 2023-11-13 DIAGNOSIS — Z1152 Encounter for screening for COVID-19: Secondary | ICD-10-CM

## 2023-11-13 DIAGNOSIS — I469 Cardiac arrest, cause unspecified: Principal | ICD-10-CM | POA: Diagnosis present

## 2023-11-13 DIAGNOSIS — G931 Anoxic brain damage, not elsewhere classified: Secondary | ICD-10-CM | POA: Diagnosis present

## 2023-11-13 DIAGNOSIS — T43621A Poisoning by amphetamines, accidental (unintentional), initial encounter: Secondary | ICD-10-CM | POA: Diagnosis present

## 2023-11-13 DIAGNOSIS — J9601 Acute respiratory failure with hypoxia: Secondary | ICD-10-CM | POA: Diagnosis present

## 2023-11-13 DIAGNOSIS — F909 Attention-deficit hyperactivity disorder, unspecified type: Secondary | ICD-10-CM | POA: Diagnosis present

## 2023-11-13 DIAGNOSIS — K625 Hemorrhage of anus and rectum: Secondary | ICD-10-CM | POA: Diagnosis not present

## 2023-11-13 DIAGNOSIS — R570 Cardiogenic shock: Secondary | ICD-10-CM | POA: Diagnosis present

## 2023-11-13 DIAGNOSIS — F1110 Opioid abuse, uncomplicated: Secondary | ICD-10-CM | POA: Diagnosis present

## 2023-11-13 DIAGNOSIS — Z7189 Other specified counseling: Secondary | ICD-10-CM | POA: Diagnosis not present

## 2023-11-13 DIAGNOSIS — A4102 Sepsis due to Methicillin resistant Staphylococcus aureus: Secondary | ICD-10-CM | POA: Diagnosis present

## 2023-11-13 DIAGNOSIS — F418 Other specified anxiety disorders: Secondary | ICD-10-CM | POA: Diagnosis not present

## 2023-11-13 DIAGNOSIS — E875 Hyperkalemia: Secondary | ICD-10-CM | POA: Diagnosis present

## 2023-11-13 DIAGNOSIS — N179 Acute kidney failure, unspecified: Secondary | ICD-10-CM | POA: Diagnosis not present

## 2023-11-13 DIAGNOSIS — J9602 Acute respiratory failure with hypercapnia: Secondary | ICD-10-CM | POA: Diagnosis present

## 2023-11-13 DIAGNOSIS — R739 Hyperglycemia, unspecified: Secondary | ICD-10-CM | POA: Diagnosis present

## 2023-11-13 DIAGNOSIS — N390 Urinary tract infection, site not specified: Secondary | ICD-10-CM | POA: Diagnosis present

## 2023-11-13 DIAGNOSIS — N170 Acute kidney failure with tubular necrosis: Secondary | ICD-10-CM | POA: Diagnosis present

## 2023-11-13 DIAGNOSIS — E872 Acidosis, unspecified: Secondary | ICD-10-CM | POA: Diagnosis present

## 2023-11-13 DIAGNOSIS — E876 Hypokalemia: Secondary | ICD-10-CM | POA: Diagnosis not present

## 2023-11-13 DIAGNOSIS — J15212 Pneumonia due to Methicillin resistant Staphylococcus aureus: Secondary | ICD-10-CM | POA: Diagnosis present

## 2023-11-13 DIAGNOSIS — R0603 Acute respiratory distress: Secondary | ICD-10-CM | POA: Diagnosis not present

## 2023-11-13 DIAGNOSIS — Z66 Do not resuscitate: Secondary | ICD-10-CM | POA: Diagnosis not present

## 2023-11-13 DIAGNOSIS — I429 Cardiomyopathy, unspecified: Secondary | ICD-10-CM | POA: Diagnosis present

## 2023-11-13 DIAGNOSIS — J9600 Acute respiratory failure, unspecified whether with hypoxia or hypercapnia: Secondary | ICD-10-CM | POA: Diagnosis not present

## 2023-11-13 DIAGNOSIS — K7200 Acute and subacute hepatic failure without coma: Secondary | ICD-10-CM | POA: Diagnosis present

## 2023-11-13 DIAGNOSIS — R578 Other shock: Secondary | ICD-10-CM | POA: Diagnosis present

## 2023-11-13 DIAGNOSIS — F411 Generalized anxiety disorder: Secondary | ICD-10-CM | POA: Diagnosis present

## 2023-11-13 DIAGNOSIS — G9382 Brain death: Secondary | ICD-10-CM | POA: Diagnosis not present

## 2023-11-13 DIAGNOSIS — T40601A Poisoning by unspecified narcotics, accidental (unintentional), initial encounter: Secondary | ICD-10-CM | POA: Diagnosis present

## 2023-11-13 DIAGNOSIS — R579 Shock, unspecified: Secondary | ICD-10-CM

## 2023-11-13 DIAGNOSIS — J15211 Pneumonia due to Methicillin susceptible Staphylococcus aureus: Secondary | ICD-10-CM | POA: Diagnosis not present

## 2023-11-13 DIAGNOSIS — Y92012 Bathroom of single-family (private) house as the place of occurrence of the external cause: Secondary | ICD-10-CM

## 2023-11-13 LAB — COMPREHENSIVE METABOLIC PANEL WITH GFR
ALT: 71 U/L — ABNORMAL HIGH (ref 0–44)
AST: 99 U/L — ABNORMAL HIGH (ref 15–41)
Albumin: 2.7 g/dL — ABNORMAL LOW (ref 3.5–5.0)
Alkaline Phosphatase: 86 U/L (ref 38–126)
Anion gap: 14 (ref 5–15)
BUN: 12 mg/dL (ref 6–20)
CO2: 20 mmol/L — ABNORMAL LOW (ref 22–32)
Calcium: 8.7 mg/dL — ABNORMAL LOW (ref 8.9–10.3)
Chloride: 100 mmol/L (ref 98–111)
Creatinine, Ser: 1.54 mg/dL — ABNORMAL HIGH (ref 0.61–1.24)
GFR, Estimated: 58 mL/min — ABNORMAL LOW (ref >60)
Glucose, Bld: 277 mg/dL — ABNORMAL HIGH (ref 70–99)
Potassium: >7.5 mmol/L (ref 3.5–5.1)
Sodium: 134 mmol/L — ABNORMAL LOW (ref 135–145)
Total Bilirubin: 0.5 mg/dL (ref 0.0–1.2)
Total Protein: 6.1 g/dL — ABNORMAL LOW (ref 6.5–8.1)

## 2023-11-13 LAB — PROTIME-INR
INR: 1.4 — ABNORMAL HIGH (ref 0.8–1.2)
Prothrombin Time: 17.5 s — ABNORMAL HIGH (ref 11.4–15.2)

## 2023-11-13 LAB — I-STAT ARTERIAL BLOOD GAS, ED
Acid-base deficit: 9 mmol/L — ABNORMAL HIGH (ref 0.0–2.0)
Bicarbonate: 23.6 mmol/L (ref 20.0–28.0)
Calcium, Ion: 1.17 mmol/L (ref 1.15–1.40)
HCT: 32 % — ABNORMAL LOW (ref 39.0–52.0)
Hemoglobin: 10.9 g/dL — ABNORMAL LOW (ref 13.0–17.0)
O2 Saturation: 100 %
Patient temperature: 94.5
Potassium: 6.1 mmol/L — ABNORMAL HIGH (ref 3.5–5.1)
Sodium: 136 mmol/L (ref 135–145)
TCO2: 26 mmol/L (ref 22–32)
pCO2 arterial: 86.5 mmHg (ref 32–48)
pH, Arterial: 7.029 — CL (ref 7.35–7.45)
pO2, Arterial: 420 mmHg — ABNORMAL HIGH (ref 83–108)

## 2023-11-13 LAB — URINALYSIS, ROUTINE W REFLEX MICROSCOPIC
Bilirubin Urine: NEGATIVE
Glucose, UA: NEGATIVE mg/dL
Hgb urine dipstick: NEGATIVE
Ketones, ur: NEGATIVE mg/dL
Nitrite: NEGATIVE
Protein, ur: 100 mg/dL — AB
Specific Gravity, Urine: 1.018 (ref 1.005–1.030)
WBC, UA: >50 WBC/hpf (ref 0–5)
pH: 8 (ref 5.0–8.0)

## 2023-11-13 LAB — CG4 I-STAT (LACTIC ACID)
Lactic Acid, Venous: 5 mmol/L (ref 0.5–1.9)
Lactic Acid, Venous: 6.5 mmol/L (ref 0.5–1.9)

## 2023-11-13 LAB — POCT I-STAT 7, (LYTES, BLD GAS, ICA,H+H)
Acid-base deficit: 5 mmol/L — ABNORMAL HIGH (ref 0.0–2.0)
Bicarbonate: 18.4 mmol/L — ABNORMAL LOW (ref 20.0–28.0)
Calcium, Ion: 1.11 mmol/L — ABNORMAL LOW (ref 1.15–1.40)
HCT: 50 % (ref 39.0–52.0)
Hemoglobin: 17 g/dL (ref 13.0–17.0)
O2 Saturation: 100 %
Patient temperature: 36
Potassium: 3.3 mmol/L — ABNORMAL LOW (ref 3.5–5.1)
Sodium: 137 mmol/L (ref 135–145)
TCO2: 19 mmol/L — ABNORMAL LOW (ref 22–32)
pCO2 arterial: 28.4 mmHg — ABNORMAL LOW (ref 32–48)
pH, Arterial: 7.413 (ref 7.35–7.45)
pO2, Arterial: 463 mmHg — ABNORMAL HIGH (ref 83–108)

## 2023-11-13 LAB — CBC WITH DIFFERENTIAL/PLATELET
Abs Immature Granulocytes: 0 K/uL (ref 0.00–0.07)
Basophils Absolute: 0 K/uL (ref 0.0–0.1)
Basophils Relative: 0 %
Eosinophils Absolute: 0.3 K/uL (ref 0.0–0.5)
Eosinophils Relative: 3 %
HCT: 38.6 % — ABNORMAL LOW (ref 39.0–52.0)
Hemoglobin: 12.3 g/dL — ABNORMAL LOW (ref 13.0–17.0)
Lymphocytes Relative: 23 %
Lymphs Abs: 2.6 K/uL (ref 0.7–4.0)
MCH: 32.9 pg (ref 26.0–34.0)
MCHC: 31.9 g/dL (ref 30.0–36.0)
MCV: 103.2 fL — ABNORMAL HIGH (ref 80.0–100.0)
Monocytes Absolute: 0.8 K/uL (ref 0.1–1.0)
Monocytes Relative: 7 %
Neutro Abs: 7.4 K/uL (ref 1.7–7.7)
Neutrophils Relative %: 67 %
Platelets: 210 K/uL (ref 150–400)
RBC: 3.74 MIL/uL — ABNORMAL LOW (ref 4.22–5.81)
RDW: 12.5 % (ref 11.5–15.5)
WBC: 11.1 K/uL — ABNORMAL HIGH (ref 4.0–10.5)
nRBC: 0.2 % (ref 0.0–0.2)
nRBC: 1 /100{WBCs} — ABNORMAL HIGH

## 2023-11-13 LAB — CBC
HCT: 44.1 % (ref 39.0–52.0)
Hemoglobin: 15 g/dL (ref 13.0–17.0)
MCH: 33.3 pg (ref 26.0–34.0)
MCHC: 34 g/dL (ref 30.0–36.0)
MCV: 97.8 fL (ref 80.0–100.0)
Platelets: 218 K/uL (ref 150–400)
RBC: 4.51 MIL/uL (ref 4.22–5.81)
RDW: 12.3 % (ref 11.5–15.5)
WBC: 22 K/uL — ABNORMAL HIGH (ref 4.0–10.5)
nRBC: 0 % (ref 0.0–0.2)

## 2023-11-13 LAB — BASIC METABOLIC PANEL WITH GFR
Anion gap: 18 — ABNORMAL HIGH (ref 5–15)
BUN: 19 mg/dL (ref 6–20)
CO2: 20 mmol/L — ABNORMAL LOW (ref 22–32)
Calcium: 8 mg/dL — ABNORMAL LOW (ref 8.9–10.3)
Chloride: 98 mmol/L (ref 98–111)
Creatinine, Ser: 1.65 mg/dL — ABNORMAL HIGH (ref 0.61–1.24)
GFR, Estimated: 54 mL/min — ABNORMAL LOW (ref >60)
Glucose, Bld: 231 mg/dL — ABNORMAL HIGH (ref 70–99)
Potassium: 4 mmol/L (ref 3.5–5.1)
Sodium: 136 mmol/L (ref 135–145)

## 2023-11-13 LAB — I-STAT CHEM 8, ED
BUN: 15 mg/dL (ref 6–20)
Calcium, Ion: 1.01 mmol/L — ABNORMAL LOW (ref 1.15–1.40)
Chloride: 102 mmol/L (ref 98–111)
Creatinine, Ser: 1.2 mg/dL (ref 0.61–1.24)
Glucose, Bld: 250 mg/dL — ABNORMAL HIGH (ref 70–99)
HCT: 38 % — ABNORMAL LOW (ref 39.0–52.0)
Hemoglobin: 12.9 g/dL — ABNORMAL LOW (ref 13.0–17.0)
Potassium: 6.1 mmol/L — ABNORMAL HIGH (ref 3.5–5.1)
Sodium: 134 mmol/L — ABNORMAL LOW (ref 135–145)
TCO2: 23 mmol/L (ref 22–32)

## 2023-11-13 LAB — GLUCOSE, CAPILLARY
Glucose-Capillary: 160 mg/dL — ABNORMAL HIGH (ref 70–99)
Glucose-Capillary: 244 mg/dL — ABNORMAL HIGH (ref 70–99)
Glucose-Capillary: 262 mg/dL — ABNORMAL HIGH (ref 70–99)

## 2023-11-13 LAB — RAPID URINE DRUG SCREEN, HOSP PERFORMED
Amphetamines: POSITIVE — AB
Barbiturates: NOT DETECTED
Benzodiazepines: NOT DETECTED
Cocaine: NOT DETECTED
Opiates: NOT DETECTED
Tetrahydrocannabinol: NOT DETECTED

## 2023-11-13 LAB — TROPONIN I (HIGH SENSITIVITY)
Troponin I (High Sensitivity): 344 ng/L (ref <18)
Troponin I (High Sensitivity): 17921 ng/L (ref <18)

## 2023-11-13 LAB — TYPE AND SCREEN
ABO/RH(D): B POS
Antibody Screen: NEGATIVE

## 2023-11-13 LAB — MRSA NEXT GEN BY PCR, NASAL: MRSA by PCR Next Gen: DETECTED — AB

## 2023-11-13 LAB — PHOSPHORUS: Phosphorus: 11.8 mg/dL — ABNORMAL HIGH (ref 2.5–4.6)

## 2023-11-13 LAB — APTT: aPTT: 52 s — ABNORMAL HIGH (ref 24–36)

## 2023-11-13 LAB — MAGNESIUM: Magnesium: 3 mg/dL — ABNORMAL HIGH (ref 1.7–2.4)

## 2023-11-13 LAB — TRIGLYCERIDES: Triglycerides: 101 mg/dL (ref <150)

## 2023-11-13 LAB — HEMOGLOBIN A1C
Hgb A1c MFr Bld: 5.1 % (ref 4.8–5.6)
Mean Plasma Glucose: 99.67 mg/dL

## 2023-11-13 LAB — HIV ANTIBODY (ROUTINE TESTING W REFLEX): HIV Screen 4th Generation wRfx: NONREACTIVE

## 2023-11-13 LAB — I-STAT CG4 LACTIC ACID, ED: Lactic Acid, Venous: 10.5 mmol/L (ref 0.5–1.9)

## 2023-11-13 MED ORDER — DOCUSATE SODIUM 50 MG/5ML PO LIQD: 100.0000 mg | Freq: Two times a day (BID) | ORAL | Status: DC | PRN

## 2023-11-13 MED ORDER — SODIUM ZIRCONIUM CYCLOSILICATE 10 G PO PACK
10.0000 g | PACK | Freq: Once | ORAL | Status: DC
  Filled 2023-11-13: qty 1

## 2023-11-13 MED ORDER — HEPARIN SODIUM (PORCINE) 5000 UNIT/ML IJ SOLN: 5000.0000 [IU] | Freq: Three times a day (TID) | INTRAMUSCULAR | Status: DC

## 2023-11-13 MED ORDER — LACTATED RINGERS IV BOLUS
1000.0000 mL | Freq: Once | INTRAVENOUS | Status: AC
  Administered 2023-11-13: 1000 mL via INTRAVENOUS

## 2023-11-13 MED ORDER — INSULIN ASPART 100 UNIT/ML IJ SOLN
0.0000 [IU] | INTRAMUSCULAR | Status: DC
  Administered 2023-11-13: 5 [IU] via SUBCUTANEOUS
  Administered 2023-11-14: 2 [IU] via SUBCUTANEOUS
  Administered 2023-11-14 (×2): 1 [IU] via SUBCUTANEOUS
  Administered 2023-11-14 (×2): 2 [IU] via SUBCUTANEOUS
  Administered 2023-11-14: 1 [IU] via SUBCUTANEOUS
  Administered 2023-11-15 (×3): 2 [IU] via SUBCUTANEOUS
  Administered 2023-11-15: 1 [IU] via SUBCUTANEOUS
  Administered 2023-11-15: 2 [IU] via SUBCUTANEOUS

## 2023-11-13 MED ORDER — ORAL CARE MOUTH RINSE
15.0000 mL | OROMUCOSAL | Status: DC | PRN
Start: 2023-11-13 — End: 2023-11-18

## 2023-11-13 MED ORDER — CHLORHEXIDINE GLUCONATE CLOTH 2 % EX PADS
6.0000 | MEDICATED_PAD | Freq: Every day | CUTANEOUS | Status: DC
  Administered 2023-11-15 – 2023-11-16 (×2): 6 via TOPICAL

## 2023-11-13 MED ORDER — PANTOPRAZOLE SODIUM 40 MG IV SOLR
40.0000 mg | Freq: Every day | INTRAVENOUS | Status: DC
  Administered 2023-11-13 – 2023-11-16 (×4): 40 mg via INTRAVENOUS
  Filled 2023-11-13 (×4): qty 10

## 2023-11-13 MED ORDER — PROPOFOL 1000 MG/100ML IV EMUL: 0.0000 ug/kg/min | INTRAVENOUS | Status: DC

## 2023-11-13 MED ORDER — INSULIN ASPART 100 UNIT/ML IV SOLN
10.0000 [IU] | Freq: Once | INTRAVENOUS | Status: DC
Start: 2023-11-13 — End: 2023-11-14

## 2023-11-13 MED ORDER — NOREPINEPHRINE 4 MG/250ML-% IV SOLN
0.0000 ug/min | INTRAVENOUS | Status: DC
  Administered 2023-11-13: 10 ug/min via INTRAVENOUS
  Administered 2023-11-13: 15 ug/min via INTRAVENOUS
  Filled 2023-11-13: qty 250

## 2023-11-13 MED ORDER — VASOPRESSIN 20 UNITS/100 ML INFUSION FOR SHOCK
0.0000 [IU]/min | INTRAVENOUS | Status: DC
  Administered 2023-11-13 – 2023-11-14 (×3): 0.03 [IU]/min via INTRAVENOUS
  Administered 2023-11-15: 0.02 [IU]/min via INTRAVENOUS
  Filled 2023-11-13 (×5): qty 100

## 2023-11-13 MED ORDER — ARFORMOTEROL TARTRATE 15 MCG/2ML IN NEBU
15.0000 ug | INHALATION_SOLUTION | Freq: Two times a day (BID) | RESPIRATORY_TRACT | Status: DC
  Administered 2023-11-14 – 2023-11-17 (×7): 15 ug via RESPIRATORY_TRACT
  Filled 2023-11-13 (×7): qty 2

## 2023-11-13 MED ORDER — SODIUM CHLORIDE 0.9 % IV SOLN
2.0000 g | INTRAVENOUS | Status: DC
  Administered 2023-11-13 – 2023-11-15 (×3): 2 g via INTRAVENOUS
  Filled 2023-11-13 (×3): qty 20

## 2023-11-13 MED ORDER — ORAL CARE MOUTH RINSE
15.0000 mL | OROMUCOSAL | Status: DC
  Administered 2023-11-13 – 2023-11-17 (×46): 15 mL via OROMUCOSAL

## 2023-11-13 MED ORDER — MAGNESIUM SULFATE 2 GM/50ML IV SOLN
2.0000 g | Freq: Once | INTRAVENOUS | Status: AC
  Administered 2023-11-13: 2 g via INTRAVENOUS

## 2023-11-13 MED ORDER — REVEFENACIN 175 MCG/3ML IN SOLN
175.0000 ug | Freq: Every day | RESPIRATORY_TRACT | Status: DC
  Administered 2023-11-14 – 2023-11-17 (×4): 175 ug via RESPIRATORY_TRACT
  Filled 2023-11-13 (×4): qty 3

## 2023-11-13 MED ORDER — ONDANSETRON HCL 4 MG/2ML IJ SOLN: 4.0000 mg | Freq: Four times a day (QID) | INTRAMUSCULAR | Status: DC | PRN

## 2023-11-13 MED ORDER — SODIUM CHLORIDE 0.9 % IV SOLN
250.0000 mL | INTRAVENOUS | Status: AC
  Administered 2023-11-13: 250 mL via INTRAVENOUS

## 2023-11-13 MED ORDER — SODIUM BICARBONATE 8.4 % IV SOLN
50.0000 meq | Freq: Once | INTRAVENOUS | Status: AC
  Administered 2023-11-13: 50 meq via INTRAVENOUS
  Filled 2023-11-13: qty 50

## 2023-11-13 MED ORDER — STERILE WATER FOR INJECTION IV SOLN
INTRAVENOUS | Status: DC
  Filled 2023-11-13 (×2): qty 1000

## 2023-11-13 MED ORDER — ALBUTEROL SULFATE (2.5 MG/3ML) 0.083% IN NEBU
10.0000 mg | INHALATION_SOLUTION | Freq: Once | RESPIRATORY_TRACT | Status: AC
  Administered 2023-11-13: 10 mg via RESPIRATORY_TRACT
  Filled 2023-11-13: qty 12

## 2023-11-13 MED ORDER — ACETAMINOPHEN 325 MG PO TABS
650.0000 mg | ORAL_TABLET | ORAL | Status: DC | PRN
  Administered 2023-11-14: 650 mg via ORAL
  Filled 2023-11-13: qty 2

## 2023-11-13 MED ORDER — POLYETHYLENE GLYCOL 3350 17 G PO PACK
17.0000 g | PACK | Freq: Every day | ORAL | Status: DC | PRN
Start: 2023-11-13 — End: 2023-11-18

## 2023-11-13 MED ORDER — CALCIUM GLUCONATE-NACL 1-0.675 GM/50ML-% IV SOLN
1.0000 g | Freq: Once | INTRAVENOUS | Status: AC
  Administered 2023-11-13: 1000 mg via INTRAVENOUS
  Filled 2023-11-13: qty 50

## 2023-11-13 MED ORDER — NOREPINEPHRINE 4 MG/250ML-% IV SOLN
0.0000 ug/min | INTRAVENOUS | Status: DC
  Administered 2023-11-14: 25 ug/min via INTRAVENOUS
  Administered 2023-11-14: 30 ug/min via INTRAVENOUS
  Administered 2023-11-14: 25 ug/min via INTRAVENOUS
  Administered 2023-11-14: 15 ug/min via INTRAVENOUS
  Filled 2023-11-13 (×4): qty 250

## 2023-11-13 NOTE — ED Provider Notes (Signed)
 [place] EMERGENCY DEPARTMENT AT [facility] Provider Note   CSN: [id number] Arrival date & time: 11/13/23  1612     Patient presents with: No chief complaint on file.   Angel Scott is a 38 y.o. male.   HPI 38 year old male presents post CPR.  At this time history is only available through EMS.  The patient was found unresponsive in the bathroom by a new girlfriend.  This was in a hotel.  Unclear how long he had been down but his CPR was estimated to be about 30 minutes.  This involved an initial rhythm of asystole and a total of 4 epinephrines and 2 mg of Narcan.  Currently he has had ROSC and was intubated by EMS.  He has show no significant function since ROSC.  Prior to Admission medications   Medication Sig Start Date End Date Taking? Authorizing Provider  amphetamine-dextroamphetamine (ADDERALL) 20 MG tablet Take 20 mg by mouth 2 (two) times daily.     [provider]  buprenorphine  (SUBUTEX ) 2 MG SUBL SL tablet Place 8 mg under the tongue daily.    [provider]  clonazePAM (KLONOPIN) 0.5 MG tablet Take 2 mg by mouth 2 (two) times daily as needed for anxiety.    [provider]    Allergies: Patient has no known allergies.    Review of Systems  Unable to perform ROS: Patient unresponsive    Updated Vital Signs BP (!) 149/64   Pulse 98   Temp (!) 93 F (33.9 C)   Resp (!) 30   Ht 6' 4 (1.93 m)   SpO2 100%   BMI 26.78 kg/m   Physical Exam Vitals and nursing note reviewed.  Constitutional:      Appearance: He is well-developed.     Interventions: He is intubated.  HENT:     Head: Normocephalic and atraumatic.  Eyes:     Comments: Pupils are mid-sized and non-reactive to light  Cardiovascular:     Rate and Rhythm: Normal rate and regular rhythm.     Heart sounds: Normal heart sounds.  Pulmonary:     Effort: He is intubated.     Breath sounds: Normal breath sounds.  Abdominal:     General: There is no distension.      Palpations: Abdomen is soft.  Musculoskeletal:     Cervical back: Neck supple.  Skin:    General: Skin is warm and dry.  Neurological:     Mental Status: He is unresponsive.     GCS: GCS eye subscore is 1. GCS verbal subscore is 1. GCS motor subscore is 1.     (all labs ordered are listed, but only abnormal results are displayed) Labs Reviewed  COMPREHENSIVE METABOLIC PANEL WITH GFR - Abnormal; Notable for the following components:      Result Value   Sodium 134 (*)    Potassium >7.5 (*)    CO2 20 (*)    Glucose, Bld 277 (*)    Creatinine, Ser 1.54 (*)    Calcium  8.7 (*)    Total Protein 6.1 (*)    Albumin  2.7 (*)    AST 99 (*)    ALT 71 (*)    GFR, Estimated 58 (*)    All other components within normal limits  APTT - Abnormal; Notable for the following components:   aPTT 52 (*)    All other components within normal limits  PROTIME-INR - Abnormal; Notable for the following components:   Prothrombin  Time 17.5 (*)    INR 1.4 (*)    All other components within normal limits  MAGNESIUM  - Abnormal; Notable for the following components:   Magnesium  3.0 (*)    All other components within normal limits  PHOSPHORUS - Abnormal; Notable for the following components:   Phosphorus 11.8 (*)    All other components within normal limits  RAPID URINE DRUG SCREEN, HOSP PERFORMED - Abnormal; Notable for the following components:   Amphetamines POSITIVE (*)    All other components within normal limits  URINALYSIS, ROUTINE W REFLEX MICROSCOPIC - Abnormal; Notable for the following components:   APPearance CLOUDY (*)    Protein, ur 100 (*)    Leukocytes,Ua SMALL (*)    Bacteria, UA MANY (*)    All other components within normal limits  CBC WITH DIFFERENTIAL/PLATELET - Abnormal; Notable for the following components:   WBC 11.1 (*)    RBC 3.74 (*)    Hemoglobin 12.3 (*)    HCT 38.6 (*)    MCV 103.2 (*)    nRBC 1 (*)    All other components within normal limits  GLUCOSE,  CAPILLARY - Abnormal; Notable for the following components:   Glucose-Capillary 244 (*)    All other components within normal limits  I-STAT CG4 LACTIC ACID, ED - Abnormal; Notable for the following components:   Lactic Acid, Venous 10.5 (*)    All other components within normal limits  I-STAT CHEM 8, ED - Abnormal; Notable for the following components:   Sodium 134 (*)    Potassium 6.1 (*)    Glucose, Bld 250 (*)    Calcium , Ion 1.01 (*)    Hemoglobin 12.9 (*)    HCT 38.0 (*)    All other components within normal limits  I-STAT ARTERIAL BLOOD GAS, ED - Abnormal; Notable for the following components:   pH, Arterial 7.029 (*)    pCO2 arterial 86.5 (*)    pO2, Arterial 420 (*)    Acid-base deficit 9.0 (*)    Potassium 6.1 (*)    HCT 32.0 (*)    Hemoglobin 10.9 (*)    All other components within normal limits  TROPONIN I (HIGH SENSITIVITY) - Abnormal; Notable for the following components:   Troponin I (High Sensitivity) 344 (*)    All other components within normal limits  CULTURE, BLOOD (ROUTINE X 2)  CULTURE, BLOOD (ROUTINE X 2)  CULTURE, RESPIRATORY W GRAM STAIN  MRSA NEXT GEN BY PCR, NASAL  CULTURE, BLOOD (ROUTINE X 2)  CULTURE, BLOOD (ROUTINE X 2)  CULTURE, RESPIRATORY W GRAM STAIN  TRIGLYCERIDES  BLOOD GAS, ARTERIAL  HIV ANTIBODY (ROUTINE TESTING W REFLEX)  CBC  BASIC METABOLIC PANEL WITH GFR  PHOSPHORUS  MAGNESIUM   HEMOGLOBIN A1C  DRUG SCREEN, UR (12+OXYCODONE+CRT)  URINALYSIS, W/ REFLEX TO CULTURE (INFECTION SUSPECTED)  BASIC METABOLIC PANEL WITH GFR  CBC  CBG MONITORING, ED  I-STAT CG4 LACTIC ACID, ED  I-STAT CG4 LACTIC ACID, ED  I-STAT CG4 LACTIC ACID, ED  TYPE AND SCREEN  TROPONIN I (HIGH SENSITIVITY)    EKG: EKG Interpretation Date/Time:  Saturday November 13 2023 16:52:06 EDT Ventricular Rate:  88 PR Interval:  166 QRS Duration:  117 QT Interval:  412 QTC Calculation: 499 R Axis:   78  Text Interpretation: Sinus rhythm Nonspecific intraventricular  conduction delay diffuse ST changes mildly improved compared to earlier in the day Confirmed by Freddi Hamilton [id number]) on 11/13/2023 4:55:13 PM  Radiology: CT HEAD WO CONTRAST Result  Date: 11/13/2023 EXAM: CT HEAD AND CERVICAL SPINE 11/13/2023 06:23:43 PM TECHNIQUE: CT of the head and cervical spine was performed without the administration of intravenous contrast. Multiplanar reformatted images are provided for review. Automated exposure control, iterative reconstruction, and/or weight based adjustment of the mA/kV was utilized to reduce the radiation dose to as low as reasonably achievable. COMPARISON: 09/22/2009 CLINICAL HISTORY: Mental status change, unknown cause. Non con. found unresponsive by GF in bathroom. Fire arrived 1st and found patient pulseless and apneic and asystole. 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 No acute intracranial hemorrhage. No mass effect or midline shift. No abnormal extra-axial fluid collection. Gray-white differentiation is maintained. No hydrocephalus. ORBITS: No acute abnormality. SINUSES AND MASTOIDS: No acute abnormality. SOFT TISSUES AND SKULL: No acute skull fracture. No acute soft tissue abnormality. CT CERVICAL SPINE BONES AND ALIGNMENT: No acute fracture or traumatic malalignment. DEGENERATIVE CHANGES: No significant degenerative changes. SOFT TISSUES: No prevertebral soft tissue swelling. IMPRESSION: 1. 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 This appearance raises concern for impending tonsillar herniation. 2. Normal cervical spine CT. Electronically signed by: Pinkie Pebbles MD 11/13/2023 07:07 PM EDT RP Workstation: [id number]   CT CERVICAL SPINE WO CONTRAST Result Date: 11/13/2023 EXAM: CT HEAD AND CERVICAL SPINE 11/13/2023 06:23:43 PM TECHNIQUE: CT of the  head and cervical spine was performed without the administration of intravenous contrast. Multiplanar reformatted images are provided for review. Automated exposure control, iterative reconstruction, and/or weight based adjustment of the mA/kV was utilized to reduce the radiation dose to as low as reasonably achievable. COMPARISON: 09/22/2009 CLINICAL HISTORY: Mental status change, unknown cause. Non con. found unresponsive by GF in bathroom. Fire arrived 1st and found patient pulseless and apneic and asystole. 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 No acute intracranial hemorrhage. No mass effect or midline shift. No abnormal extra-axial fluid collection. Gray-white differentiation is maintained. No hydrocephalus. ORBITS: No acute abnormality. SINUSES AND MASTOIDS: No acute abnormality. SOFT TISSUES AND SKULL: No acute skull fracture. No acute soft tissue abnormality. CT CERVICAL SPINE BONES AND ALIGNMENT: No acute fracture or traumatic malalignment. DEGENERATIVE CHANGES: No significant degenerative changes. SOFT TISSUES: No prevertebral soft tissue swelling. IMPRESSION: 1. 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 This appearance raises concern for impending tonsillar herniation. 2. Normal cervical spine CT. Electronically signed by: Pinkie Pebbles MD 11/13/2023 07:07 PM EDT RP Workstation: [id number]   DG Chest Portable 1 View Result Date: 11/13/2023 CLINICAL DATA:  Central line placement. EXAM: PORTABLE CHEST 1 VIEW COMPARISON:  11/13/2023 at 5:09 p.m. FINDINGS: Right internal jugular central venous catheter. Tip projects in the lower superior vena cava. Endotracheal tube tip remains high, currently measured at 7.3 cm above the carina. Orogastric tube passes below the included field of view. Lungs  essentially clear. No pneumothorax. IMPRESSION: 1. Well-positioned right internal jugular central venous catheter. No pneumothorax. 2. Endotracheal tube tip projects high, 7.3 cm above the carinal. Recommend further insertion at least 4 cm for more optimal positioning. Electronically Signed   By: Alm Parkins M.D.   On: 11/13/2023 19:00   DG Chest [facility] 1 View Result  Date: 11/13/2023 CLINICAL DATA:  Unresponsive patient. Found unresponsive in a bathroom. First found pulseless. EXAM: PORTABLE CHEST 1 VIEW COMPARISON:  09/22/2009. FINDINGS: Endotracheal tube tip projects 9.9 cm above the carinal. Nasal/orogastric tube passes below the diaphragm and into the stomach, below the included field of view. Cardiac silhouette normal in size configuration. Normal mediastinal and hilar contours. Clear lungs. No gross pleural effusion or pneumothorax on this supine exam. Skeletal structures are unremarkable. IMPRESSION: 1. No active cardiopulmonary disease. 2. High position of the endotracheal tube, tip 9.9 cm above the carinal. Recommend inserting 6-7 cm for better positioning. 3. Nasal/orogastric tube well positioned. Electronically Signed   By: Alm Parkins M.D.   On: 11/13/2023 17:53   DG Abd Portable 1V Result Date: 11/13/2023 CLINICAL DATA:  Patient arrived by [facility] from home after being found unresponsive by GF in bathroom. Fire arrived 1st and found patient pulseless and apneic and asystole. EXAM: PORTABLE ABDOMEN - 1 VIEW COMPARISON:  CT abdomen pelvis 09/22/2009. FINDINGS: Enteric tube with tip and side port overlying the expected region of the gastric lumen. Cardiac paddles noted. Foley temperature probe noted overlying the pelvis. The bowel gas pattern is normal. No radio-opaque calculi or other significant radiographic abnormality are seen. Stool throughout the majority of the colon. IMPRESSION: 1. Enteric tube in good position. 2. Nonobstructive bowel gas pattern. Stool throughout the majority of the  colon-correlate for constipation. Electronically Signed   By: Morgane  Naveau M.D.   On: 11/13/2023 17:52     Central Line  Date/Time: 11/13/2023 5:42 PM  Performed by: Freddi Hamilton, MD Authorized by: Freddi Hamilton, MD   Consent:    Consent obtained:  Emergent situation Pre-procedure details:    Indication(s): central venous access     Hand hygiene: Hand hygiene performed prior to insertion     Sterile barrier technique: All elements of maximal sterile technique followed     Skin preparation:  Chlorhexidine    Skin preparation agent: Skin preparation agent completely dried prior to procedure   Procedure details:    Location:  R internal jugular   Patient position:  Trendelenburg   Landmarks identified: yes     Ultrasound guidance: yes     Ultrasound guidance timing: real time     Sterile ultrasound techniques: Sterile gel and sterile probe covers were used     Number of attempts:  1   Successful placement: yes   Post-procedure details:    Post-procedure:  Dressing applied and line sutured   Assessment:  Blood return through all ports, free fluid flow, placement verified by x-ray and no pneumothorax on x-ray   Procedure completion:  Tolerated well, no immediate complications .Ultrasound ED Peripheral IV (Provider)  Date/Time: 11/13/2023 5:43 PM  Performed by: Freddi Hamilton, MD Authorized by: Freddi Hamilton, MD   Procedure details:    Indications: multiple failed IV attempts and poor IV access     Skin Prep: chlorhexidine  gluconate     Location:  Left AC   Angiocath:  18 G   Patient tolerated procedure without complications: Yes     Dressing applied: Yes   .Ultrasound ED Peripheral IV (Provider)  Date/Time: 11/13/2023 5:43 PM  Performed by: Freddi Hamilton, MD Authorized by: Freddi Hamilton, MD   Procedure details:    Indications: multiple failed IV attempts and poor IV access     Skin Prep: chlorhexidine  gluconate     Location:  Right AC   Angiocath:  18 G    Bedside Ultrasound Guided: Yes  Patient tolerated procedure without complications: Yes     Dressing applied: Yes   .Critical Care  Performed by: Freddi Hamilton, MD Authorized by: Freddi Hamilton, MD   Critical care provider statement:    Critical care time (minutes):  80   Critical care time was exclusive of:  Separately billable procedures and treating other patients   Critical care was necessary to treat or prevent imminent or life-threatening deterioration of the following conditions:  CNS failure or compromise and shock   Critical care was time spent personally by me on the following activities:  Development of treatment plan with patient or surrogate, discussions with consultants, evaluation of patient's response to treatment, examination of patient, ordering and review of laboratory studies, ordering and review of radiographic studies, ordering and performing treatments and interventions, pulse oximetry, re-evaluation of patient's condition and review of old charts    Medications Ordered in the ED  Chlorhexidine  Gluconate Cloth 2 % PADS 6 each (has no administration in time range)  0.9 %  sodium chloride  infusion (has no administration in time range)  norepinephrine  (LEVOPHED ) 4mg  in (0.016 mg/mL) premix infusion (25 mcg/min Intravenous Rate/Dose Change 11/13/23 1742)  vasopressin  (PITRESSIN) 20 Units in 100 mL (0.2 unit/mL) infusion-*FOR SHOCK* (0.04 Units/min Intravenous Rate/Dose Change 11/13/23 1745)  docusate (COLACE) 50 MG/5ML liquid 100 mg (has no administration in time range)  polyethylene glycol (MIRALAX  / GLYCOLAX ) packet 17 g (has no administration in time range)  heparin  injection 5,000 Units (has no administration in time range)  pantoprazole  (PROTONIX ) injection 40 mg (has no administration in time range)  acetaminophen  (TYLENOL ) tablet 650 mg (has no administration in time range)  ondansetron  (ZOFRAN ) injection 4 mg (has no administration in time range)  insulin   aspart (novoLOG ) injection 0-9 Units (has no administration in time range)  sodium bicarbonate  injection 50 mEq (has no administration in time range)  insulin  aspart (novoLOG ) injection 10 Units (has no administration in time range)  cefTRIAXone  (ROCEPHIN ) 2 g in sodium chloride  0.9 % 100 mL IVPB (has no administration in time range)  revefenacin  (YUPELRI ) nebulizer solution 175 mcg (has no administration in time range)  arformoterol  (BROVANA ) nebulizer solution 15 mcg (has no administration in time range)  sodium zirconium cyclosilicate  (LOKELMA ) packet 10 g (has no administration in time range)  sodium bicarbonate  150 mEq in sterile water  1,150 mL infusion (has no administration in time range)  lactated ringers  bolus 1,000 mL (0 mLs Intravenous Stopped 11/13/23 1641)  magnesium  sulfate IVPB 2 g 50 mL (0 g Intravenous Stopped 11/13/23 1752)  calcium  gluconate 1 g/ 50 mL sodium chloride  IVPB (1,000 mg Intravenous New Bag/Given 11/13/23 1712)  albuterol  (PROVENTIL ) (2.5 MG/3ML) 0.083% nebulizer solution 10 mg (10 mg Nebulization Given 11/13/23 1830)                                    Medical Decision Making Amount and/or Complexity of Data Reviewed Labs: ordered.    Details: Respiratory acidosis.  Hyperkalemia Radiology: ordered and independent interpretation performed.    Details: No pneumonia or pneumothorax. ECG/medicine tests: ordered and independent interpretation performed.    Details: Prolonged QTc and nonspecific ST changes  Risk OTC drugs. Prescription drug management. Decision regarding hospitalization.   Patient presents after cardiac arrest.  Initially hypertensive but quickly decompensated and required pressors and fluids for hypotension.  Ultimately required multiple pressors and a central line was placed as above.  His  initial i-STAT potassium was a little high at 6.1 so potassium was given but is unclear if this was hemolyzed and so I waited on his CMP which ended up  showing a critically high potassium over 7.5 without hemolysis.  ICU had taken over care by the time this potassium came back and started potassium treatment.  Otherwise, patient remains a GCS of 3.  No family is available.  He did also get some magnesium  for what looks like a ventricular arrhythmia during resuscitation which quelled this.  Admitted to the ICU in critical condition.     Final diagnoses:  Cardiac arrest (HCC)  Shock (HCC)  Acute respiratory failure with hypoxia and hypercapnia (HCC)  Hyperkalemia    ED Discharge Orders     None          Freddi Hamilton, MD 11/13/23 1930

## 2023-11-13 NOTE — ED Notes (Signed)
 Pt placed on bair hugger

## 2023-11-13 NOTE — Progress Notes (Signed)
 ETT advanced 1 cm at this time, unable to advance anymore due to being a 6.0 ETT.. MD Gretta made aware.

## 2023-11-13 NOTE — ED Triage Notes (Addendum)
 Patient arrived by [facility] from home after being found unresponsive by GF in bathroom. Fire arrived 1st and found patient pulseless and apneic and asystole. CPR started at 1500-CPR then PEA EMS administered- EPI x 4  Narcan X 2 (2 MG) CBG 214 ROSC 1529  ETT tube 6.0 IO left tib 76M   Arrived with pulses\, no spontaneous respirations. No movement

## 2023-11-13 NOTE — ED Notes (Signed)
MD notified of Lactic result.

## 2023-11-13 NOTE — Progress Notes (Signed)
 MD Freddi obtains secondary IV access prior to IV team arriving to bedside. To return for additional vascular access needs.

## 2023-11-13 NOTE — Progress Notes (Addendum)
 eLink Physician-Brief Progress Note Patient Name: Angel Scott DOB: 11-03-1985 MRN: [id number]   Date of Service  11/13/2023  HPI/Events of Note  K recheck 4. Also having BRBPR  eICU Interventions  May hold Lokelma  Holding prophylactic heparin      Intervention Category Intermediate Interventions: Electrolyte abnormality - evaluation and management Minor Interventions: Communication with other healthcare providers and/or family  [name] 11/13/2023, 11:00 PM

## 2023-11-13 NOTE — Procedures (Signed)
 Intubation Procedure Note  Luisdavid Hamblin  [id number]  May 27, 1985  Date:11/13/23  Time:6:58 PM   Provider Performing:Shawneequa Baldridge P Gretta    Procedure: Intubation (31500)- exchange ETT   Indication(s) Respiratory Failure  Consent Unable to obtain consent due to emergent nature of procedure.   Anesthesia none   Time Out Verified patient identification, verified procedure, site/side was marked, verified correct patient position, special equipment/implants available, medications/allergies/relevant history reviewed, required imaging and test results available.   Sterile Technique Usual hand hygeine, masks, and gloves were used   Procedure Description Patient positioned in bed supine.  Sedation given as noted above.  Patient was intubated with endotracheal tube using Glidescope.  View was Grade 1 full glottis .  Number of attempts was 1.  Exchanged over airway exchange catheter. Colorimetric CO2 detector was consistent with tracheal placement.   Complications/Tolerance None; patient tolerated the procedure well. Chest X-ray is ordered to verify placement.   EBL Minimal   Specimen(s) None  Leita SHAUNNA Gretta, DO 11/13/23 6:58 PM [facility]  For contact information, see Amion. If no response to pager, please call PCCM consult pager. After hours, 7PM- 7AM, please call Elink.

## 2023-11-13 NOTE — Progress Notes (Signed)
 eLink Physician-Brief Progress Note Patient Name: Angel Scott DOB: Sep 08, 1985 MRN: [id number]   Date of Service  11/13/2023  HPI/Events of Note  Notified of troponin > 17,000 Post cardiac arrest  eICU Interventions  Not a candidate for revascularization due to  poor chance of meaningful neurologic recovery     Intervention Category Intermediate Interventions: Diagnostic test evaluation  [name] 11/13/2023, 10:29 PM

## 2023-11-13 NOTE — Progress Notes (Signed)
 eLink Physician-Brief Progress Note Patient Name: Angel Scott DOB: 01-Sep-1985 MRN: [id number]   Date of Service  11/13/2023  HPI/Events of Note  CXR 7:10 pm reviewed. ETT 5 mm from carina. New central line tip seen at the level of the SVC.  eICU Interventions  Recommend pulling back ETT by 3 cm. Discussed with RT Norepinephrine  changed to central line dose as requested.     Intervention Category Intermediate Interventions: Other:  [name] 11/13/2023, 7:50 PM

## 2023-11-13 NOTE — H&P (Signed)
 NAME:  Angel Scott, MRN:  [id number], DOB:  10-13-1985, LOS: 0 ADMISSION DATE:  11/13/2023, CONSULTATION DATE:  7/19 REFERRING MD:  Jaynie, CHIEF COMPLAINT:  cardiac arrest   History of Present Illness:  Mr. Angel Scott is a 38 year old gentleman with a history of opiate abuse who presented to the hospital with a cardiac arrest after being found down in a bathroom by his girlfriend.  He had been down an unknown amount of time.  When the emergency responders arrived he was apneic, pulseless, asystole.  CPR was initiated at that time.  He received 4 doses of epinephrine  and 2 doses of Narcan in the field.  ROSC was achieved 29 minutes after the start of chest compressions.  An IO and 600 endotracheal tube were placed in the field.  Upon arrival to the ER he was not taking any spontaneous respirations, relying on bag-valve-mask.  He was connected to mechanical ventilation.  He was initially hypertensive but subsequently required vasopressors-norepinephrine  and vasopressin  have been started.  PCCM consulted for admission.  Pertinent  Medical History  Opiate abuse HCV ADHD GAD  Significant Hospital Events: Including procedures, antibiotic start and stop dates in addition to other pertinent events   7/19 cardiac arrest, admitted  Interim History / Subjective:    Objective    Blood pressure (!) 88/45, pulse 86, temperature (!) 92.6 F (33.7 C), resp. rate (!) 30, height 6' 4 (1.93 m), SpO2 100%.    Vent Mode: PRVC FiO2 (%):  [40 %-100 %] 40 % Set Rate:  [20 bmp-30 bmp] 30 bmp Vt Set:  [329 mL] 670 mL PEEP:  [5 cmH20] 5 cmH20  No intake or output data in the 24 hours ending 11/13/23 1737 There were no vitals filed for this visit.  Examination: General: Critically ill-appearing man lying in bed intubated, not sedated, in Trendelenburg position HENT: [place]/AT, eyes anicteric, endotracheal tube in place Lungs: Diffuse bilateral wheezing, Pplat <20 with high peak pressures.  No endotracheal  secretions. Cardiovascular: S1-S2, regular rate and rhythm Abdomen: Soft, nontender Extremities: No peripheral edema, no cyanosis Neuro: Unresponsive to verbal or painful stimulation and.  No cough or gag.  Unreactive dilated pupils.  No response to pain x 4 extremities or trapezius squeeze bilaterally.  No corneal reflexes.  Lactic acid 10.5 7.03/87/420/24 Potassium 6.1 on i-STAT BUN 15 Creatinine 1.2 WBC 11.1 H/H 12.3/38.6 Platelets 210 INR 1.4 UA greater than 50 WBCs CXR personally reviewed-endotracheal tube almost 10 cm above the carina.  No pneumothorax or infiltrates EKG personally reviewed-normal sinus rhythm, normal axis.  Delayed R wave progression across precordials.  Borderline prolonged QTc.  Mild inferolateral ST depressions.  Resolved problem list   Assessment and Plan  Cardiac arrest-suspect opiate overdose as the precipitating cause.  Unknown downtime with initial rhythm of asystole.  30 minutes of CPR before ROSC. Lactic acidosis due to malperfusion Troponin elevation due to chest compressions and cardiac arrest -Supportive care - Not a TTM candidate - Avoid fevers -Monitor on telemetry -Check rapid drug screen and send out test for synthetics  Shock, likely cardiogenic and distributive -Norepinephrine  and vasopressin  - Can add epinephrine  infusion as needed - Bicarb infusion - Check echocardiogram - Empiric antibiotics  Acute encephalopathy, concern for anoxic brain injury - Head CT - Avoid fevers - Avoid electrolyte swings, hyper and hypoglycemia, hypocapnia  Acute respiratory failure with hypoxia and hypercapnia requiring mechanical ventilation - LTVV, 4-8 cc/kg ideal body weight with goal plateau less than 30 and driving pressure less than 15. -PAD  protocol for sedation - VAP prevention protocol - Empiric ceftriaxone  for aspiration coverage -With wheezing start Brovana  and Yupelri ; getting prolonged albuterol  neb now for hypokalemia as  well -Advance endotracheal tube 6 cm  AKI due to cardiac arrest, suspect ATN Hyperphosphatemia - Strict I's/O - Renally dose meds and avoid nephrotoxic meds - Monitor -Maintain adequate perfusion - Start bicarbonate infusion  Shock liver - Monitor LFTs  Hyperglycemia - Sign scale insulin  as needed - Check A1c  Hyperkalemia - Verify on labs> confirmed to be high - Bicarb, insulin  plus dextrose , albuterol , Lokelma  - Monitor - Repeat BMP after treatment - Start bicarbonate infusion  Borderline prolonged QTc - Monitor electrolytes - Avoid QTc prolonging medications - Monitor on telemetry  Urinary tract infection -Ceftriaxone  - Add on urine culture  History of opiate abuse - Check blood cultures -Check HIV   I attempted to call his mother at the number listed in the chart- left a message to please call back.  Best Practice (right click and Reselect all SmartList Selections daily)   Diet/type: NPO DVT prophylaxis prophylactic heparin   Pressure ulcer(s): Defer to RN assessment GI prophylaxis: PPI Lines: Central line Foley:  Yes, and it is still needed Code Status:  full code; no family at bedside Last date of multidisciplinary goals of care discussion [no family at bedside]  Labs   CBC: Recent Labs  Lab 11/13/23 1631 11/13/23 1645 11/13/23 1700  WBC  --  11.1*  --   NEUTROABS  --  7.4  --   HGB 12.9* 12.3* 10.9*  HCT 38.0* 38.6* 32.0*  MCV  --  103.2*  --   PLT  --  210  --     Basic Metabolic Panel: Recent Labs  Lab 11/13/23 1631 11/13/23 1700  NA 134* 136  K 6.1* 6.1*  CL 102  --   GLUCOSE 250*  --   BUN 15  --   CREATININE 1.20  --    GFR: CrCl cannot be calculated (Unknown ideal weight.). Recent Labs  Lab 11/13/23 1632 11/13/23 1645  WBC  --  11.1*  LATICACIDVEN 10.5*  --     Liver Function Tests: No results for input(s): AST, ALT, ALKPHOS, BILITOT, PROT, ALBUMIN  in the last 168 hours. No results for input(s):  LIPASE, AMYLASE in the last 168 hours. No results for input(s): AMMONIA in the last 168 hours.  ABG    Component Value Date/Time   PHART 7.029 (LL) 11/13/2023 1700   PCO2ART 86.5 (HH) 11/13/2023 1700   PO2ART 420 (H) 11/13/2023 1700   HCO3 23.6 11/13/2023 1700   TCO2 26 11/13/2023 1700   ACIDBASEDEF 9.0 (H) 11/13/2023 1700   O2SAT 100 11/13/2023 1700     Coagulation Profile: Recent Labs  Lab 11/13/23 1645  INR 1.4*    Cardiac Enzymes: No results for input(s): CKTOTAL, CKMB, CKMBINDEX, TROPONINI in the last 168 hours.  HbA1C: No results found for: HGBA1C  CBG: No results for input(s): GLUCAP in the last 168 hours.  Review of Systems:   Unable to be obtained due to mental status.   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   Surgical History:  History reviewed. No pertinent surgical history.   Social History:   reports that he has never smoked. He has never used smokeless tobacco. He reports that he does not drink alcohol  and does not use  drugs.   Family History:  His family history is not on file.   Allergies No Known Allergies   Home Medications  Prior to Admission medications   Medication Sig Start Date End Date Taking? Authorizing Provider  amphetamine-dextroamphetamine (ADDERALL) 20 MG tablet Take 20 mg by mouth 2 (two) times daily.     [provider]  buprenorphine  (SUBUTEX ) 2 MG SUBL SL tablet Place 8 mg under the tongue daily.    [provider]  clonazePAM (KLONOPIN) 0.5 MG tablet Take 2 mg by mouth 2 (two) times daily as needed for anxiety.    [provider]     Critical care time:       This patient is critically ill with multiple organ system failure which requires frequent high complexity decision making, assessment, support, evaluation, and titration of  therapies. This was completed through the application of advanced monitoring technologies and extensive interpretation of multiple databases. During this encounter critical care time was devoted to patient care services described in this note for 55 minutes.  Leita SHAUNNA Gaskins, DO 11/13/23 6:19 PM [facility]  For contact information, see Amion. If no response to pager, please call PCCM consult pager. After hours, 7PM- 7AM, please call Elink.

## 2023-11-13 NOTE — Progress Notes (Signed)
 eLink Physician-Brief Progress Note Patient Name: Angel Scott DOB: [birth year] MRN: [id number]   Date of Service  11/13/2023  HPI/Events of Note  Notified that patient had very large BRBPR, recheck H/H just sent to labs. Most recent Hgb 10.2 Has been able to titrated down norepinephrine  Repeat lactate 5, trending down  eICU Interventions  Notify eLink of H/H results or if with hemodynamic changes     Intervention Category Intermediate Interventions: Bleeding - evaluation and treatment with blood products  [name] 11/13/2023, 9:16 PM

## 2023-11-13 NOTE — Progress Notes (Signed)
 eLink Physician-Brief Progress Note Patient Name: Angel Scott DOB: [birth year] MRN: [id number]   Date of Service  11/13/2023  HPI/Events of Note  56 M opiate abuse, Hep C, anxiety, ADHD, brought as a cardiac arrest after girlfriend found him down in the bathroom unknown downtime. On EMS arrival he was in asystole with ROSC in approximately 29 minutes after 4 epi and 2 naloxone. Tox screen positive for amphetamines (prescribed). Head CT with signs of anoxic injury. Intubated in the field, tube exchange done in ICU. CXR pending.  eICU Interventions  Out of hospital cardiac arrest, suspect drug overdose, drug screen pending, unknown downtime, anoxic injury on CT.     Intervention Category Evaluation Type: New Patient Evaluation  [name] 11/13/2023, 7:05 PM

## 2023-11-13 NOTE — Progress Notes (Signed)
 RT note. ETT exchanged from 6.0 to 8.0 with MD at bedside. RT will continue to monitor.

## 2023-11-13 NOTE — Progress Notes (Signed)
 Pt transported to CT and back to [facility] via ventilator without any complications.

## 2023-11-13 NOTE — Progress Notes (Signed)
 Pt transported to 2M11 from [facility] without any complications.

## 2023-11-14 ENCOUNTER — Inpatient Hospital Stay (HOSPITAL_COMMUNITY)

## 2023-11-14 DIAGNOSIS — I469 Cardiac arrest, cause unspecified: Secondary | ICD-10-CM | POA: Diagnosis not present

## 2023-11-14 DIAGNOSIS — G9340 Encephalopathy, unspecified: Secondary | ICD-10-CM | POA: Diagnosis not present

## 2023-11-14 DIAGNOSIS — J9601 Acute respiratory failure with hypoxia: Secondary | ICD-10-CM | POA: Diagnosis not present

## 2023-11-14 DIAGNOSIS — R579 Shock, unspecified: Secondary | ICD-10-CM | POA: Diagnosis not present

## 2023-11-14 LAB — BASIC METABOLIC PANEL WITH GFR
Anion gap: 16 — ABNORMAL HIGH (ref 5–15)
BUN: 24 mg/dL — ABNORMAL HIGH (ref 6–20)
CO2: 20 mmol/L — ABNORMAL LOW (ref 22–32)
Calcium: 8.2 mg/dL — ABNORMAL LOW (ref 8.9–10.3)
Chloride: 98 mmol/L (ref 98–111)
Creatinine, Ser: 2.32 mg/dL — ABNORMAL HIGH (ref 0.61–1.24)
GFR, Estimated: 36 mL/min — ABNORMAL LOW (ref >60)
Glucose, Bld: 190 mg/dL — ABNORMAL HIGH (ref 70–99)
Potassium: 3.2 mmol/L — ABNORMAL LOW (ref 3.5–5.1)
Sodium: 134 mmol/L — ABNORMAL LOW (ref 135–145)

## 2023-11-14 LAB — CBC
HCT: 50.5 % (ref 39.0–52.0)
Hemoglobin: 17.1 g/dL — ABNORMAL HIGH (ref 13.0–17.0)
MCH: 32.6 pg (ref 26.0–34.0)
MCHC: 33.9 g/dL (ref 30.0–36.0)
MCV: 96.4 fL (ref 80.0–100.0)
Platelets: 240 K/uL (ref 150–400)
RBC: 5.24 MIL/uL (ref 4.22–5.81)
RDW: 12.5 % (ref 11.5–15.5)
WBC: 13.7 K/uL — ABNORMAL HIGH (ref 4.0–10.5)
nRBC: 0 % (ref 0.0–0.2)

## 2023-11-14 LAB — GLUCOSE, CAPILLARY
Glucose-Capillary: 169 mg/dL — ABNORMAL HIGH (ref 70–99)
Glucose-Capillary: 151 mg/dL — ABNORMAL HIGH (ref 70–99)
Glucose-Capillary: 122 mg/dL — ABNORMAL HIGH (ref 70–99)
Glucose-Capillary: 143 mg/dL — ABNORMAL HIGH (ref 70–99)
Glucose-Capillary: 137 mg/dL — ABNORMAL HIGH (ref 70–99)
Glucose-Capillary: 158 mg/dL — ABNORMAL HIGH (ref 70–99)

## 2023-11-14 LAB — LACTIC ACID, PLASMA
Lactic Acid, Venous: 4.2 mmol/L (ref 0.5–1.9)
Lactic Acid, Venous: 3.2 mmol/L (ref 0.5–1.9)

## 2023-11-14 LAB — ECHOCARDIOGRAM COMPLETE
AR max vel: 2.85 cm2
AV Area VTI: 2.97 cm2
AV Area mean vel: 2.75 cm2
AV Mean grad: 1 mmHg
AV Peak grad: 1.7 mmHg
Ao pk vel: 0.66 m/s
Area-P 1/2: 6.12 cm2
Height: 76 in
S' Lateral: 4 cm

## 2023-11-14 LAB — ABO/RH: ABO/RH(D): B POS

## 2023-11-14 LAB — MAGNESIUM: Magnesium: 2.4 mg/dL (ref 1.7–2.4)

## 2023-11-14 LAB — PHOSPHORUS: Phosphorus: <1 mg/dL — CL (ref 2.5–4.6)

## 2023-11-14 LAB — CG4 I-STAT (LACTIC ACID): Lactic Acid, Venous: 6.3 mmol/L (ref 0.5–1.9)

## 2023-11-14 MED ORDER — HYDROCORTISONE SOD SUC (PF) 100 MG IJ SOLR
100.0000 mg | Freq: Three times a day (TID) | INTRAMUSCULAR | Status: DC
  Administered 2023-11-14 – 2023-11-16 (×7): 100 mg via INTRAVENOUS
  Filled 2023-11-14 (×7): qty 2

## 2023-11-14 MED ORDER — SODIUM CHLORIDE 0.9 % IV SOLN: INTRAVENOUS | Status: AC | PRN

## 2023-11-14 MED ORDER — LACTATED RINGERS IV BOLUS
1000.0000 mL | Freq: Once | INTRAVENOUS | Status: AC
  Administered 2023-11-14: 1000 mL via INTRAVENOUS

## 2023-11-14 MED ORDER — NOREPINEPHRINE 16 MG/250ML-% IV SOLN
0.0000 ug/min | INTRAVENOUS | Status: DC
  Administered 2023-11-14: 20 ug/min via INTRAVENOUS
  Administered 2023-11-17: 2 ug/min via INTRAVENOUS
  Filled 2023-11-14 (×4): qty 250

## 2023-11-14 MED ORDER — MUPIROCIN 2 % EX OINT
1.0000 | TOPICAL_OINTMENT | Freq: Two times a day (BID) | CUTANEOUS | Status: DC
  Administered 2023-11-14 – 2023-11-17 (×7): 1 via NASAL
  Filled 2023-11-14: qty 22

## 2023-11-14 MED ORDER — ACETAMINOPHEN 325 MG PO TABS: 650.0000 mg | ORAL_TABLET | ORAL | Status: DC | PRN

## 2023-11-14 MED ORDER — PERFLUTREN LIPID MICROSPHERE
1.0000 mL | INTRAVENOUS | Status: AC | PRN
  Administered 2023-11-14: 4 mL via INTRAVENOUS

## 2023-11-14 MED ORDER — POTASSIUM PHOSPHATES 15 MMOLE/5ML IV SOLN
15.0000 mmol | Freq: Once | INTRAVENOUS | Status: AC
  Administered 2023-11-14: 15 mmol via INTRAVENOUS
  Filled 2023-11-14: qty 5

## 2023-11-14 NOTE — Procedures (Signed)
 Arterial Catheter Insertion Procedure Note  Angel Scott  [id number]  Apr 03, 1986  Date:11/14/23  Time:10:47 PM    Provider Performing: Tinnie FORBES Furth    Procedure: Insertion of Arterial Line (63379) with US  guidance (23062)   Indication(s) Blood pressure monitoring and/or need for frequent ABGs  Consent Risks of the procedure as well as the alternatives and risks of each were explained to the patient and/or caregiver.  Consent for the procedure was obtained and is signed in the bedside chart  Anesthesia None   Time Out Verified patient identification, verified procedure, site/side was marked, verified correct patient position, special equipment/implants available, medications/allergies/relevant history reviewed, required imaging and test results available.   Sterile Technique Maximal sterile technique including full sterile barrier drape, hand hygiene, sterile gown, sterile gloves, mask, hair covering, sterile ultrasound probe cover (if used).   Procedure Description Area of catheter insertion was cleaned with chlorhexidine  and draped in sterile fashion. With real-time ultrasound guidance an arterial catheter was placed into the left axillary artery.  Appropriate arterial tracings confirmed on monitor.     Complications/Tolerance None; patient tolerated the procedure well.   EBL Minimal   Specimen(s) None   Tinnie FORBES Furth, PA-C [facility] 11/14/23 10:47 PM  Please see [email or web address] for pager details.  From 7A-7P if no response, please call [phone] After hours, please call ELink [phone]

## 2023-11-14 NOTE — Progress Notes (Signed)
 eLink Physician-Brief Progress Note Patient Name: Ethanjames Fontenot DOB: [birth year] MRN: [id number]   Date of Service  11/14/2023  HPI/Events of Note  Notified by lab of phos < 1 Most recent K 3.3  eICU Interventions  Ordered kphos 15 mmol x 1     Intervention Category Intermediate Interventions: Electrolyte abnormality - evaluation and management  [name] 11/14/2023, 3:39 AM

## 2023-11-14 NOTE — Progress Notes (Signed)
 NAME:  Angel Scott, MRN:  [id number], DOB:  06-11-1985, LOS: 1 ADMISSION DATE:  11/13/2023, CONSULTATION DATE:  7/19 REFERRING MD:  Jaynie, CHIEF COMPLAINT:  cardiac arrest   History of Present Illness:  [name] is a 38 year old gentleman with a history of opiate abuse who presented to the hospital with a cardiac arrest after being found down in a bathroom by his girlfriend.  He had been down an unknown amount of time.  When the emergency responders arrived he was apneic, pulseless, asystole.  CPR was initiated at that time.  He received 4 doses of epinephrine  and 2 doses of Narcan in the field.  ROSC was achieved 29 minutes after the start of chest compressions.  An IO and 600 endotracheal tube were placed in the field.  Upon arrival to the ER he was not taking any spontaneous respirations, relying on bag-valve-mask.  He was connected to mechanical ventilation.  He was initially hypertensive but subsequently required vasopressors-norepinephrine  and vasopressin  have been started.  PCCM consulted for admission.  Pertinent  Medical History  Opiate abuse HCV ADHD GAD  Significant Hospital Events: Including procedures, antibiotic start and stop dates in addition to other pertinent events   7/19 cardiac arrest, admitted  Interim History / Subjective:  Rising pressor requirement, no sedation  Objective    Blood pressure 111/88, pulse (!) 113, temperature 99.3 F (37.4 C), resp. rate (!) 28, height 6' 4 (1.93 m), SpO2 100%.    Vent Mode: PRVC FiO2 (%):  [40 %-100 %] 40 % Set Rate:  [20 bmp-30 bmp] 28 bmp Vt Set:  [650 mL-670 mL] 650 mL PEEP:  [5 cmH20] 5 cmH20 Plateau Pressure:  [16 cmH20] 16 cmH20   Intake/Output Summary (Last 24 hours) at 11/14/2023 0742 Last data filed at 11/14/2023 0600 Gross per 24 hour  Intake 2262.94 ml  Output 100 ml  Net 2162.94 ml   There were no vitals filed for this visit.  Examination: General: Critically ill-appearing man lying in bed  intubated, not sedated\ HENT: [name], eyes anicteric, endotracheal tube in place Lungs: distant coars ventilated sounds Cardiovascular: S1-S2, regular rate and rhythm Abdomen: Soft, nontender Extremities: No peripheral edema, no cyanosis Neuro: Unresponsive to verbal or painful stimulation and.  No cough or gag.  Unreactive dilated pupils.  No response to pain x 4 extremities or trapezius squeeze bilaterally.  No corneal reflexes.  Labs and images/studies reviewed  Resolved problem list   Assessment and Plan  Cardiac arrest-suspect opiate overdose as the precipitating cause.  Unknown downtime with initial rhythm of asystole.  30 minutes of CPR before ROSC. Lactic acidosis due to malperfusion Troponin elevation due to chest compressions and cardiac arrest - Supportive care - Not a TTM candidate - Avoid fevers - Monitor on telemetry - Amphetamine + rapid drug screen, f/u send out test for synthetics  Shock, likely cardiogenic and distributive -Norepinephrine  and vasopressin  - Stress Dose steroids added 7/20 - D/C Bicarb infusion with improved pH - Check echocardiogram - Empiric antibiotics  Acute encephalopathy, concern for anoxic brain injury with concern for impending herniation - Head CT global anoxic injury with loss of sulci and basilar cisterns with impending herniation - Avoid fevers, not candidate for TTM - Serial assessment to see if qualifies for brain death testing after 24 hours of admission  Acute respiratory failure with hypoxia and hypercapnia requiring mechanical ventilation - PRVC, VAP bundle, stress ulcer ppx - Empiric ceftriaxone  for aspiration coverage -LABA/LAMA  AKI due to cardiac arrest, suspect ATN  Hyperphosphatemia - Strict I's/O - Renally dose meds and avoid nephrotoxic meds - Monitor -Maintain adequate perfusion - Not HD candidate  Shock liver - Monitor LFTs  Hyperglycemia - Sliding scale insulin  as needed  Hyperkalemia - Trend, no HD  candidate  Borderline prolonged QTc - Monitor electrolytes - Avoid QTc prolonging medications - Monitor on telemetry  Urinary tract infection - Ceftriaxone  - f/u urine culture  History of opiate abuse - Check blood cultures - negative HIV   Best Practice (right click and Reselect all SmartList Selections daily)   Diet/type: NPO DVT prophylaxis prophylactic heparin   Pressure ulcer(s): Defer to RN assessment GI prophylaxis: PPI Lines: Central line Foley:  Yes, and it is still needed Code Status:  full code; no family at bedside Last date of multidisciplinary goals of care discussion [no family at bedside, did not answer phone]  Labs   CBC: Recent Labs  Lab 11/13/23 1645 11/13/23 1700 11/13/23 2055 11/13/23 2338 11/14/23 0221  WBC 11.1*  --  22.0*  --  13.7*  NEUTROABS 7.4  --   --   --   --   HGB 12.3* 10.9* 15.0 17.0 17.1*  HCT 38.6* 32.0* 44.1 50.0 50.5  MCV 103.2*  --  97.8  --  96.4  PLT 210  --  218  --  240    Basic Metabolic Panel: Recent Labs  Lab 11/13/23 1631 11/13/23 1645 11/13/23 1700 11/13/23 2055 11/13/23 2338 11/14/23 0221  NA 134* 134* 136 136 137 134*  K 6.1* >7.5* 6.1* 4.0 3.3* 3.2*  CL 102 100  --  98  --  98  CO2  --  20*  --  20*  --  20*  GLUCOSE 250* 277*  --  231*  --  190*  BUN 15 12  --  19  --  24*  CREATININE 1.20 1.54*  --  1.65*  --  2.32*  CALCIUM   --  8.7*  --  8.0*  --  8.2*  MG  --  3.0*  --   --   --  2.4  PHOS  --  11.8*  --   --   --  <1.0*   GFR: CrCl cannot be calculated (Unknown ideal weight.). Recent Labs  Lab 11/13/23 1632 11/13/23 1645 11/13/23 2055 11/13/23 2058 11/13/23 2258 11/14/23 0221 11/14/23 0228  WBC  --  11.1* 22.0*  --   --  13.7*  --   LATICACIDVEN 10.5*  --   --  5.0* 6.5*  --  6.3*    Liver Function Tests: Recent Labs  Lab 11/13/23 1645  AST 99*  ALT 71*  ALKPHOS 86  BILITOT 0.5  PROT 6.1*  ALBUMIN  2.7*   No results for input(s): LIPASE, AMYLASE in the last 168  hours. No results for input(s): AMMONIA in the last 168 hours.  ABG    Component Value Date/Time   PHART 7.413 11/13/2023 2338   PCO2ART 28.4 (L) 11/13/2023 2338   PO2ART 463 (H) 11/13/2023 2338   HCO3 18.4 (L) 11/13/2023 2338   TCO2 19 (L) 11/13/2023 2338   ACIDBASEDEF 5.0 (H) 11/13/2023 2338   O2SAT 100 11/13/2023 2338     Coagulation Profile: Recent Labs  Lab 11/13/23 1645  INR 1.4*    Cardiac Enzymes: No results for input(s): CKTOTAL, CKMB, CKMBINDEX, TROPONINI in the last 168 hours.  HbA1C: Hgb A1c MFr Bld  Date/Time Value Ref Range Status  11/13/2023 08:55 PM 5.1 4.8 - 5.6 % Final  Comment:    (NOTE) Diagnosis of Diabetes The following HbA1c ranges recommended by the American Diabetes Association (ADA) may be used as an aid in the diagnosis of diabetes mellitus.  Hemoglobin             Suggested A1C NGSP%              Diagnosis  <5.7                   Non Diabetic  5.7-6.4                Pre-Diabetic  >6.4                   Diabetic  <7.0                   Glycemic control for                       adults with diabetes.      CBG: Recent Labs  Lab 11/13/23 1904 11/13/23 1931 11/13/23 2331 11/14/23 0330  GLUCAP 244* 262* 160* 169*    Review of Systems:   Unable to be obtained due to mental status.   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   Surgical History:  History reviewed. No pertinent surgical history.   Social History:   reports that he has never smoked. He has never used smokeless tobacco. He reports that he does not drink alcohol  and does not use drugs.   Family History:  His family history is not on file.   Allergies No Known Allergies   Home Medications  Prior to Admission medications   Medication Sig Start Date End Date Taking? Authorizing Provider   amphetamine-dextroamphetamine (ADDERALL) 20 MG tablet Take 20 mg by mouth 2 (two) times daily.     [provider]  buprenorphine  (SUBUTEX ) 2 MG SUBL SL tablet Place 8 mg under the tongue daily.    [provider]  clonazePAM (KLONOPIN) 0.5 MG tablet Take 2 mg by mouth 2 (two) times daily as needed for anxiety.    [provider]     Critical care time:       CRITICAL CARE Performed by: Donnice JONELLE Beals   Total critical care time: 32 minutes  Critical care time was exclusive of separately billable procedures and treating other patients.  Critical care was necessary to treat or prevent imminent or life-threatening deterioration.  Critical care was time spent personally by me on the following activities: development of treatment plan with patient and/or surrogate as well as nursing, discussions with consultants, evaluation of patient's response to treatment, examination of patient, obtaining history from patient or surrogate, ordering and performing treatments and interventions, ordering and review of laboratory studies, ordering and review of radiographic studies, pulse oximetry and re-evaluation of patient's condition.   Donnice JONELLE Beals, MD 11/14/23 7:42 AM [facility]  For contact information, see Amion. If no response to pager, please call PCCM consult pager. After hours, 7PM- 7AM, please call Elink.

## 2023-11-14 NOTE — Progress Notes (Signed)
 Chaplain responded to family request for consult about the next steps to take with their son, about what to tell their 35, 28 and 40 yo grandchildren who are present at hospital entrance. Would seeing their dad hooked up to so many machines be traumatizing to them, are they ready to hear their dad will not recover, when to make decision to remove life support.  Chaplain and NP discussed options and gave parents information they need to make an informed decision about their next steps.    Children's mother arrived indicating she had already told her children their dad wasn't breathing on his own and she wasn't sure what the outcome would be.  Chaplain walked with grandmother as she went to join their grandfather and see how the children were doing.  The children's mother stayed in the room.  Chaplain will check in on grandparents prior to leaving shift and will pass on the care of this family to night Chaplain.  [place] Chaplain

## 2023-11-14 NOTE — Progress Notes (Signed)
 Mother Angel Scott took pts phone and ID home.

## 2023-11-15 ENCOUNTER — Inpatient Hospital Stay (HOSPITAL_COMMUNITY)

## 2023-11-15 DIAGNOSIS — J9602 Acute respiratory failure with hypercapnia: Secondary | ICD-10-CM | POA: Diagnosis not present

## 2023-11-15 DIAGNOSIS — I469 Cardiac arrest, cause unspecified: Secondary | ICD-10-CM | POA: Diagnosis not present

## 2023-11-15 DIAGNOSIS — J9601 Acute respiratory failure with hypoxia: Secondary | ICD-10-CM | POA: Diagnosis not present

## 2023-11-15 DIAGNOSIS — R579 Shock, unspecified: Secondary | ICD-10-CM | POA: Diagnosis not present

## 2023-11-15 LAB — BASIC METABOLIC PANEL WITH GFR
Anion gap: 14 (ref 5–15)
BUN: 48 mg/dL — ABNORMAL HIGH (ref 6–20)
CO2: 18 mmol/L — ABNORMAL LOW (ref 22–32)
Calcium: 7.7 mg/dL — ABNORMAL LOW (ref 8.9–10.3)
Chloride: 100 mmol/L (ref 98–111)
Creatinine, Ser: 3.15 mg/dL — ABNORMAL HIGH (ref 0.61–1.24)
GFR, Estimated: 25 mL/min — ABNORMAL LOW (ref >60)
Glucose, Bld: 172 mg/dL — ABNORMAL HIGH (ref 70–99)
Potassium: 3.6 mmol/L (ref 3.5–5.1)
Sodium: 132 mmol/L — ABNORMAL LOW (ref 135–145)

## 2023-11-15 LAB — CBC
HCT: 42.5 % (ref 39.0–52.0)
HCT: 39.6 % (ref 39.0–52.0)
HCT: 38.6 % — ABNORMAL LOW (ref 39.0–52.0)
Hemoglobin: 15.1 g/dL (ref 13.0–17.0)
Hemoglobin: 14.2 g/dL (ref 13.0–17.0)
Hemoglobin: 13.5 g/dL (ref 13.0–17.0)
MCH: 32.9 pg (ref 26.0–34.0)
MCH: 33.4 pg (ref 26.0–34.0)
MCH: 33 pg (ref 26.0–34.0)
MCHC: 35.5 g/dL (ref 30.0–36.0)
MCHC: 35.9 g/dL (ref 30.0–36.0)
MCHC: 35 g/dL (ref 30.0–36.0)
MCV: 92.6 fL (ref 80.0–100.0)
MCV: 93.2 fL (ref 80.0–100.0)
MCV: 94.4 fL (ref 80.0–100.0)
Platelets: 166 K/uL (ref 150–400)
Platelets: 152 K/uL (ref 150–400)
Platelets: 130 K/uL — ABNORMAL LOW (ref 150–400)
RBC: 4.59 MIL/uL (ref 4.22–5.81)
RBC: 4.25 MIL/uL (ref 4.22–5.81)
RBC: 4.09 MIL/uL — ABNORMAL LOW (ref 4.22–5.81)
RDW: 13.1 % (ref 11.5–15.5)
RDW: 13.2 % (ref 11.5–15.5)
RDW: 13.3 % (ref 11.5–15.5)
WBC: 16.8 K/uL — ABNORMAL HIGH (ref 4.0–10.5)
WBC: 19.9 K/uL — ABNORMAL HIGH (ref 4.0–10.5)
WBC: 19.7 K/uL — ABNORMAL HIGH (ref 4.0–10.5)
nRBC: 0 % (ref 0.0–0.2)
nRBC: 0 % (ref 0.0–0.2)
nRBC: 0 % (ref 0.0–0.2)

## 2023-11-15 LAB — RESP PANEL BY RT-PCR (RSV, FLU A&B, COVID)  RVPGX2
Influenza A by PCR: NEGATIVE
Influenza B by PCR: NEGATIVE
Resp Syncytial Virus by PCR: NEGATIVE
SARS Coronavirus 2 by RT PCR: NEGATIVE

## 2023-11-15 LAB — COMPREHENSIVE METABOLIC PANEL WITH GFR
ALT: 88 U/L — ABNORMAL HIGH (ref 0–44)
ALT: 83 U/L — ABNORMAL HIGH (ref 0–44)
AST: 84 U/L — ABNORMAL HIGH (ref 15–41)
AST: 74 U/L — ABNORMAL HIGH (ref 15–41)
Albumin: 2.3 g/dL — ABNORMAL LOW (ref 3.5–5.0)
Albumin: 2.3 g/dL — ABNORMAL LOW (ref 3.5–5.0)
Alkaline Phosphatase: 63 U/L (ref 38–126)
Alkaline Phosphatase: 67 U/L (ref 38–126)
Anion gap: 16 — ABNORMAL HIGH (ref 5–15)
Anion gap: 15 (ref 5–15)
BUN: 53 mg/dL — ABNORMAL HIGH (ref 6–20)
BUN: 55 mg/dL — ABNORMAL HIGH (ref 6–20)
CO2: 19 mmol/L — ABNORMAL LOW (ref 22–32)
CO2: 21 mmol/L — ABNORMAL LOW (ref 22–32)
Calcium: 7.7 mg/dL — ABNORMAL LOW (ref 8.9–10.3)
Calcium: 7.6 mg/dL — ABNORMAL LOW (ref 8.9–10.3)
Chloride: 99 mmol/L (ref 98–111)
Chloride: 100 mmol/L (ref 98–111)
Creatinine, Ser: 3.35 mg/dL — ABNORMAL HIGH (ref 0.61–1.24)
Creatinine, Ser: 3.32 mg/dL — ABNORMAL HIGH (ref 0.61–1.24)
GFR, Estimated: 23 mL/min — ABNORMAL LOW (ref >60)
GFR, Estimated: 23 mL/min — ABNORMAL LOW (ref >60)
Glucose, Bld: 176 mg/dL — ABNORMAL HIGH (ref 70–99)
Glucose, Bld: 172 mg/dL — ABNORMAL HIGH (ref 70–99)
Potassium: 3.3 mmol/L — ABNORMAL LOW (ref 3.5–5.1)
Potassium: 3.6 mmol/L (ref 3.5–5.1)
Sodium: 134 mmol/L — ABNORMAL LOW (ref 135–145)
Sodium: 136 mmol/L (ref 135–145)
Total Bilirubin: 0.7 mg/dL (ref 0.0–1.2)
Total Bilirubin: 0.6 mg/dL (ref 0.0–1.2)
Total Protein: 5.9 g/dL — ABNORMAL LOW (ref 6.5–8.1)
Total Protein: 6 g/dL — ABNORMAL LOW (ref 6.5–8.1)

## 2023-11-15 LAB — POCT I-STAT 7, (LYTES, BLD GAS, ICA,H+H)
Acid-Base Excess: 0 mmol/L (ref 0.0–2.0)
Acid-base deficit: 1 mmol/L (ref 0.0–2.0)
Bicarbonate: 21.2 mmol/L (ref 20.0–28.0)
Bicarbonate: 23.7 mmol/L (ref 20.0–28.0)
Calcium, Ion: 1.06 mmol/L — ABNORMAL LOW (ref 1.15–1.40)
Calcium, Ion: 1.05 mmol/L — ABNORMAL LOW (ref 1.15–1.40)
HCT: 39 % (ref 39.0–52.0)
HCT: 37 % — ABNORMAL LOW (ref 39.0–52.0)
Hemoglobin: 13.3 g/dL (ref 13.0–17.0)
Hemoglobin: 12.6 g/dL — ABNORMAL LOW (ref 13.0–17.0)
O2 Saturation: 95 %
O2 Saturation: 100 %
Patient temperature: 37.5
Patient temperature: 98.6
Potassium: 3.3 mmol/L — ABNORMAL LOW (ref 3.5–5.1)
Potassium: 3.5 mmol/L (ref 3.5–5.1)
Sodium: 134 mmol/L — ABNORMAL LOW (ref 135–145)
Sodium: 136 mmol/L (ref 135–145)
TCO2: 22 mmol/L (ref 22–32)
TCO2: 25 mmol/L (ref 22–32)
pCO2 arterial: 27.2 mmHg — ABNORMAL LOW (ref 32–48)
pCO2 arterial: 33.6 mmHg (ref 32–48)
pH, Arterial: 7.501 — ABNORMAL HIGH (ref 7.35–7.45)
pH, Arterial: 7.457 — ABNORMAL HIGH (ref 7.35–7.45)
pO2, Arterial: 70 mmHg — ABNORMAL LOW (ref 83–108)
pO2, Arterial: 402 mmHg — ABNORMAL HIGH (ref 83–108)

## 2023-11-15 LAB — URINALYSIS, ROUTINE W REFLEX MICROSCOPIC
Bilirubin Urine: NEGATIVE
Glucose, UA: NEGATIVE mg/dL
Ketones, ur: NEGATIVE mg/dL
Leukocytes,Ua: NEGATIVE
Nitrite: NEGATIVE
Protein, ur: 30 mg/dL — AB
Specific Gravity, Urine: 1.01 (ref 1.005–1.030)
pH: 5 (ref 5.0–8.0)

## 2023-11-15 LAB — BODY FLUID CELL COUNT WITH DIFFERENTIAL
Eos, Fluid: 0 %
Eos, Fluid: 0 %
Lymphs, Fluid: 2 %
Lymphs, Fluid: 3 %
Monocyte-Macrophage-Serous Fluid: 24 % — ABNORMAL LOW (ref 50–90)
Monocyte-Macrophage-Serous Fluid: 9 % — ABNORMAL LOW (ref 50–90)
Neutrophil Count, Fluid: 74 % — ABNORMAL HIGH (ref 0–25)
Neutrophil Count, Fluid: 88 % — ABNORMAL HIGH (ref 0–25)
Total Nucleated Cell Count, Fluid: 345 uL (ref 0–1000)
Total Nucleated Cell Count, Fluid: 425 uL (ref 0–1000)

## 2023-11-15 LAB — GLUCOSE, CAPILLARY
Glucose-Capillary: 215 mg/dL — ABNORMAL HIGH (ref 70–99)
Glucose-Capillary: 164 mg/dL — ABNORMAL HIGH (ref 70–99)
Glucose-Capillary: 163 mg/dL — ABNORMAL HIGH (ref 70–99)
Glucose-Capillary: 136 mg/dL — ABNORMAL HIGH (ref 70–99)
Glucose-Capillary: 181 mg/dL — ABNORMAL HIGH (ref 70–99)
Glucose-Capillary: 180 mg/dL — ABNORMAL HIGH (ref 70–99)
Glucose-Capillary: 171 mg/dL — ABNORMAL HIGH (ref 70–99)

## 2023-11-15 LAB — APTT
aPTT: 29 s (ref 24–36)
aPTT: 29 s (ref 24–36)

## 2023-11-15 LAB — PROTIME-INR
INR: 1.1 (ref 0.8–1.2)
INR: 1.1 (ref 0.8–1.2)
Prothrombin Time: 15 s (ref 11.4–15.2)
Prothrombin Time: 15 s (ref 11.4–15.2)

## 2023-11-15 LAB — BILIRUBIN, DIRECT
Bilirubin, Direct: <0.1 mg/dL (ref 0.0–0.2)
Bilirubin, Direct: <0.1 mg/dL (ref 0.0–0.2)

## 2023-11-15 LAB — LACTIC ACID, PLASMA
Lactic Acid, Venous: 1.6 mmol/L (ref 0.5–1.9)
Lactic Acid, Venous: 1.2 mmol/L (ref 0.5–1.9)

## 2023-11-15 LAB — CK TOTAL AND CKMB (NOT AT ARMC)
CK, MB: 21.6 ng/mL — ABNORMAL HIGH (ref 0.5–5.0)
Total CK: 825 U/L — ABNORMAL HIGH (ref 49–397)

## 2023-11-15 LAB — TROPONIN I (HIGH SENSITIVITY): Troponin I (High Sensitivity): 3262 ng/L (ref <18)

## 2023-11-15 LAB — MAGNESIUM
Magnesium: 2 mg/dL (ref 1.7–2.4)
Magnesium: 2.1 mg/dL (ref 1.7–2.4)

## 2023-11-15 LAB — FIBRINOGEN
Fibrinogen: 691 mg/dL — ABNORMAL HIGH (ref 210–475)
Fibrinogen: 710 mg/dL — ABNORMAL HIGH (ref 210–475)

## 2023-11-15 LAB — PHOSPHORUS
Phosphorus: 3.3 mg/dL (ref 2.5–4.6)
Phosphorus: 4.4 mg/dL (ref 2.5–4.6)

## 2023-11-15 MED ORDER — VANCOMYCIN HCL 2000 MG/400ML IV SOLN
2000.0000 mg | INTRAVENOUS | Status: DC
  Administered 2023-11-15: 2000 mg via INTRAVENOUS
  Filled 2023-11-15: qty 400

## 2023-11-15 MED ORDER — INSULIN ASPART 100 UNIT/ML IJ SOLN
0.0000 [IU] | Freq: Four times a day (QID) | INTRAMUSCULAR | Status: DC
  Administered 2023-11-15 – 2023-11-16 (×2): 2 [IU] via SUBCUTANEOUS
  Administered 2023-11-16: 1 [IU] via SUBCUTANEOUS
  Administered 2023-11-16: 2 [IU] via SUBCUTANEOUS
  Administered 2023-11-17 (×2): 1 [IU] via SUBCUTANEOUS

## 2023-11-15 MED ORDER — ETOMIDATE 2 MG/ML IV SOLN
INTRAVENOUS | Status: AC
  Administered 2023-11-15: 20 mg via INTRAVENOUS
  Filled 2023-11-15: qty 10

## 2023-11-15 MED ORDER — INSULIN ASPART 100 UNIT/ML IJ SOLN: 0.0000 [IU] | Freq: Four times a day (QID) | INTRAMUSCULAR | Status: DC | PRN

## 2023-11-15 MED ORDER — ALBUMIN HUMAN 25 % IV SOLN
25.0000 g | Freq: Once | INTRAVENOUS | Status: AC
  Administered 2023-11-15: 25 g via INTRAVENOUS
  Filled 2023-11-15: qty 100

## 2023-11-15 MED ORDER — ETOMIDATE 2 MG/ML IV SOLN: 20.0000 mg | Freq: Once | INTRAVENOUS | Status: AC

## 2023-11-15 MED ORDER — SODIUM CHLORIDE 0.9 % IV SOLN
2.0000 g | Freq: Two times a day (BID) | INTRAVENOUS | Status: DC
  Administered 2023-11-15 – 2023-11-17 (×4): 2 g via INTRAVENOUS
  Filled 2023-11-15 (×4): qty 12.5

## 2023-11-15 NOTE — TOC CM/SW Note (Addendum)
 Transition of Care [facility]) - Inpatient Brief Assessment   Patient Details  Name: [name] MRN: [id number] Date of Birth: 1985-10-06  [facility] CM/SW Contact:    Lauraine FORBES Saa, LCSW Phone Number: 11/15/2023, 1:56 PM   Clinical Narrative:  1:56 PM Per progressions, patient's family expressed interest in guidance on how to inform patient's children of expected in hospital death. CSW informed patient's mother, [name], of local grief counseling resources ([facility], [facility]) and encouraged [name] to contact children's school counselors and social workers to follow up. CSW informed [name] of PCMT possible assistance in informing children of patient's anticipated hospital death, as well. [name] expressed understanding of the information. Patient is expected to transition to comfort care upon brother's arrival from [place] .  Transition of Care Asessment: Insurance and Status: Insurance coverage has been reviewed Patient has primary care physician: Yes Home environment has been reviewed: Private Residence Prior level of function:: N/A Prior/Current Home Services: No current home services Social Drivers of Health Review:  (Patient unable to answer) Readmission risk has been reviewed: Yes Transition of care needs: no transition of care needs at this time

## 2023-11-15 NOTE — Progress Notes (Signed)
 eLink Physician-Brief Progress Note Patient Name: Angel Scott DOB: 1985/07/20 MRN: [id number]   Date of Service  11/15/2023  HPI/Events of Note  CBG ordered q 6 and SSI q 4 CBG 130s to 180s  eICU Interventions  Revised SSI to q6     Intervention Category Minor Interventions: Routine modifications to care plan (e.g. PRN medications for pain, fever)  Angel Scott Angel Scott 11/15/2023, 8:02 PM

## 2023-11-15 NOTE — Plan of Care (Signed)

## 2023-11-15 NOTE — Progress Notes (Signed)
 Pharmacy Antibiotic Note  Dekota Shenk is a 38 y.o. male with shock on rocephin  and [facility] requesting change to cefepime  and vancomycin     -SCr= 3.3 (baseline ~ 1.2)  Plan: -Cefepime  2gm IV q12h -Vancomycin  2000mg  IV q48h (estimated AUC= 434 using SCr 3.3) -Will follow renal function, cultures and clinical progress   Height: 6' 4 (193 cm) Weight: 92.2 kg (203 lb 4.2 oz) IBW/kg (Calculated) : 86.8  Temp (24hrs), Avg:99.9 F (37.7 C), Min:97.9 F (36.6 C), Max:101.8 F (38.8 C)  Recent Labs  Lab 11/13/23 2055 11/13/23 2058 11/14/23 0221 11/14/23 0228 11/14/23 0722 11/14/23 1023 11/15/23 0525 11/15/23 1529 11/15/23 2035  WBC 22.0*  --  13.7*  --   --   --  16.8* 19.9* 19.7*  CREATININE 1.65*  --  2.32*  --   --   --  3.15* 3.35* 3.32*  LATICACIDVEN  --    < >  --  6.3* 4.2* 3.2*  --  1.6 1.2   < > = values in this interval not displayed.    Estimated Creatinine Clearance: 36.3 mL/min (A) (by C-G formula based on SCr of 3.32 mg/dL (H)).    Allergies  Allergen Reactions   Narcan [Naloxone] Dermatitis    Antimicrobials this admission: Ceftriaxone  7/19>  7/21 Cefepime  7/21>> Vancomycin  7/21>>  Dose adjustments this admission:  Microbiology results: 7/19 MRSA pos 7/19 HIV neg 7/19 BCX- ngtd 7/19 BCX- ngtd  Thank you for allowing pharmacy to be a part of this patient's care.  Prentice Poisson, PharmD Clinical Pharmacist **Pharmacist phone directory can now be found on [email or web address] ([email or web address]).  Listed under [facility].

## 2023-11-15 NOTE — Progress Notes (Signed)
 Heart Failure Navigator Progress Note  Assessed for Heart & Vascular TOC clinic readiness.  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 Plans for transitioning to full comfort care when his brother arrives from [place] . No HF TOC. .   Navigator will sign off at this time.   Stephane Haddock, BSN, Scientist, clinical (histocompatibility and immunogenetics) Only

## 2023-11-15 NOTE — Progress Notes (Signed)
Recruitment maneuver performed at this time.

## 2023-11-15 NOTE — Plan of Care (Signed)

## 2023-11-15 NOTE — Procedures (Signed)
 Bronchoscopy Procedure Note  Angel Scott  [id number]  1985/05/05  Date:11/15/23  Time:3:35 PM   Provider Performing:Jackquline Branca   Procedure(s):  Flexible bronchoscopy with bronchial alveolar lavage (68375)  Indication(s) Acute resp failure, organ donor  Consent Risks of the procedure as well as the alternatives and risks of each were explained to the patient and/or caregiver.  Consent for the procedure was obtained and is signed in the bedside chart  Anesthesia Etomidate    Time Out Verified patient identification, verified procedure, site/side was marked, verified correct patient position, special equipment/implants available, medications/allergies/relevant history reviewed, required imaging and test results available.   Sterile Technique Usual hand hygiene, masks, gowns, and gloves were used   Procedure Description Bronchoscope advanced through endotracheal tube and into airway.  Airways were examined down to subsegmental level with findings noted below.   Following diagnostic evaluation, BAL(s) performed in Right middle lobe and left lingula with normal saline and return of mucopurulent fluid  Findings:  Scattered mucopurulent secretions noted from trachea down to bilateral lungs which was suctioned out with saline lavage.  ET tube was high and was repositioned under bronchoscopic guidance 4 to 5 cm above carina             Complications/Tolerance None; patient tolerated the procedure well. Chest X-ray is needed post procedure.   EBL Minimal   Specimen(s) RML and left lingula BAL

## 2023-11-15 NOTE — Progress Notes (Signed)
Transported patient to C.T while patient was on the ventilator. Patient remained stable during transport.

## 2023-11-15 NOTE — Progress Notes (Signed)
 eLink Physician-Brief Progress Note Patient Name: Angel Scott DOB: 07-28-1985 MRN: [id number]   Date of Service  11/15/2023  HPI/Events of Note  [facility] requests changes, they would like albumin  25 g added, rocephin  changed to cefepime  and vanc added  eICU Interventions  Vancomycin  and cefepime  ordered Albumin  25 g, 25% ordered      Intervention Category Intermediate Interventions: Other:;Communication with other healthcare providers and/or family  Angel Scott 11/15/2023, 9:58 PM

## 2023-11-15 NOTE — Progress Notes (Signed)
 NAME:  Angel Scott, MRN:  [id number], DOB:  Jun 30, 1985, LOS: 2 ADMISSION DATE:  11/13/2023, CONSULTATION DATE:  7/19 REFERRING MD:  Jaynie, CHIEF COMPLAINT:  cardiac arrest   History of Present Illness:  Angel Scott is a 38 year old gentleman with a history of opiate abuse who presented to the hospital with a cardiac arrest after being found down in a bathroom by his girlfriend.  He had been down an unknown amount of time.  When the emergency responders arrived he was apneic, pulseless, asystole.  CPR was initiated at that time.  He received 4 doses of epinephrine  and 2 doses of Narcan in the field.  ROSC was achieved 29 minutes after the start of chest compressions.  An IO and 600 endotracheal tube were placed in the field.  Upon arrival to the ER he was not taking any spontaneous respirations, relying on bag-valve-mask.  He was connected to mechanical ventilation.  He was initially hypertensive but subsequently required vasopressors-norepinephrine  and vasopressin  have been started.  PCCM consulted for admission.  Pertinent  Medical History  Opiate abuse HCV ADHD GAD  Significant Hospital Events: Including procedures, antibiotic start and stop dates in addition to other pertinent events   7/19 cardiac arrest, admitted  Interim History / Subjective:   Remains unresponsive, A-line placed overnight On norepinephrine  and vasopressin  drips  Objective    Blood pressure (!) 84/68, pulse (!) 120, temperature (!) 100.4 F (38 C), resp. rate (!) 28, height 6' 4 (1.93 m), weight 92.2 kg, SpO2 100%.    Vent Mode: PRVC FiO2 (%):  [30 %-40 %] 40 % Set Rate:  [28 bmp] 28 bmp Vt Set:  [650 mL-660 mL] 660 mL PEEP:  [5 cmH20] 5 cmH20 Plateau Pressure:  [11 cmH20-16 cmH20] 16 cmH20   Intake/Output Summary (Last 24 hours) at 11/15/2023 0744 Last data filed at 11/15/2023 0700 Gross per 24 hour  Intake 2162.49 ml  Output 900 ml  Net 1262.49 ml   Filed Weights   11/15/23 0351  Weight: 92.2  kg    Examination: Gen:      No acute distress HEENT:  EOMI, sclera anicteric, ETT Neck:     No masses; no thyromegaly Lungs:    Clear to auscultation bilaterally; normal respiratory effort CV:         Regular rate and rhythm; no murmurs Abd:      + bowel sounds; soft, non-tender; no palpable masses, no distension Ext:    No edema; adequate peripheral perfusion Neuro: Comatose.  No pupillary gag cough or corneal reflex.  Has some spontaneous breaths.  Labs/imaging reviewed Significant for sodium 132, BUN/creatinine 48/3.15 WBC 16.8 No new imaging  Resolved problem list  Hypokalemia  Assessment and Plan  Cardiac arrest-suspect opiate, amphetamine overdose as the precipitating cause.  Unknown downtime with initial rhythm of asystole.  30 minutes of CPR before ROSC. Lactic acidosis due to malperfusion Troponin elevation due to chest compressions and cardiac arrest Continue supportive care Suspect severe anoxic injury.  Though brainstem reflexes are absent he has some spontaneous breaths still. Will plan on family meeting today.  Shock, likely cardiogenic and distributive Continue norepinephrine , vasopressin .  Wean pressors as tolerated Continue stress dose steroids On empiric ceftriaxone .  Follow cultures Echocardiogram ordered   Acute respiratory failure with hypoxia and hypercapnia requiring mechanical ventilation Continue full vent support.  No plans on extubation Follow intermittent chest x-ray  AKI due to cardiac arrest, suspect ATN Recheck electrolytes and replete phosphate Monitor urine output and creatinine  Shock liver Monitor LFTs  Hyperglycemia SSI coverage  Possible urinary tract infection Ceftriaxone   Best Practice (right click and Reselect all SmartList Selections daily)   Diet/type: tubefeeds DVT prophylaxis prophylactic heparin   Pressure ulcer(s): Defer to RN assessment GI prophylaxis: PPI Lines: Central line and Arterial Line Foley:  Yes,  and it is still needed Code Status:  full code; no family at bedside Last date of multidisciplinary goals of care discussion [no family at bedside, did not answer phone]  Critical care time:     The patient is critically ill with multiple organ system failure and requires high complexity decision making for assessment and support, frequent evaluation and titration of therapies, advanced monitoring, review of radiographic studies and interpretation of complex data.   Critical Care Time devoted to patient care services, exclusive of separately billable procedures, described in this note is 35 minutes.   Hanalei Glace MD [facility] See Amion for pager  If no response to pager , please call [phone] until 7pm After 7:00 pm call Elink  [phone] 11/15/2023, 10:28 AM

## 2023-11-15 NOTE — IPAL (Signed)
  Interdisciplinary Goals of Care Family Meeting   Date carried out:: 11/15/2023  Location of the meeting: Conference room  Member's involved: Physician, Bedside Registered Nurse, and Family Member or next of kin  Durable Power of Attorney or acting medical decision maker: Parents, Sister    Discussion: We discussed goals of care for Angel Scott .  We discussed his admission with cardiac arrest and poor neurologic examination.  CT findings suggestive of severe anoxic brain injury.  They have requested DNR and will moved to comfort measures once his brother arrives from [place] .  They are requesting help bring the news to his children age 23-7 and want to ensure that he is able to donate organs.  Palliative care and [facility] consulted.  Code status: Full DNR  Disposition: Continue current acute care   Time spent for the meeting: 15 mins  Lonna Coder MD [facility] See Amion for pager  If no response to pager , please call [phone] until 7pm After 7:00 pm call Elink  [phone] 11/15/2023, 11:40 AM

## 2023-11-16 ENCOUNTER — Inpatient Hospital Stay (HOSPITAL_COMMUNITY)

## 2023-11-16 DIAGNOSIS — J9601 Acute respiratory failure with hypoxia: Secondary | ICD-10-CM | POA: Diagnosis not present

## 2023-11-16 DIAGNOSIS — J9602 Acute respiratory failure with hypercapnia: Secondary | ICD-10-CM | POA: Diagnosis not present

## 2023-11-16 DIAGNOSIS — R0603 Acute respiratory distress: Secondary | ICD-10-CM | POA: Diagnosis not present

## 2023-11-16 DIAGNOSIS — J15211 Pneumonia due to Methicillin susceptible Staphylococcus aureus: Secondary | ICD-10-CM

## 2023-11-16 DIAGNOSIS — Z515 Encounter for palliative care: Secondary | ICD-10-CM | POA: Diagnosis not present

## 2023-11-16 DIAGNOSIS — I469 Cardiac arrest, cause unspecified: Secondary | ICD-10-CM | POA: Diagnosis not present

## 2023-11-16 DIAGNOSIS — G931 Anoxic brain damage, not elsewhere classified: Secondary | ICD-10-CM

## 2023-11-16 DIAGNOSIS — Z7189 Other specified counseling: Secondary | ICD-10-CM | POA: Diagnosis not present

## 2023-11-16 LAB — POCT I-STAT 7, (LYTES, BLD GAS, ICA,H+H)
Acid-base deficit: 1 mmol/L (ref 0.0–2.0)
Acid-base deficit: 2 mmol/L (ref 0.0–2.0)
Acid-base deficit: 2 mmol/L (ref 0.0–2.0)
Acid-base deficit: 2 mmol/L (ref 0.0–2.0)
Acid-base deficit: 3 mmol/L — ABNORMAL HIGH (ref 0.0–2.0)
Acid-base deficit: 4 mmol/L — ABNORMAL HIGH (ref 0.0–2.0)
Bicarbonate: 19.4 mmol/L — ABNORMAL LOW (ref 20.0–28.0)
Bicarbonate: 20.3 mmol/L (ref 20.0–28.0)
Bicarbonate: 21 mmol/L (ref 20.0–28.0)
Bicarbonate: 21.4 mmol/L (ref 20.0–28.0)
Bicarbonate: 21.4 mmol/L (ref 20.0–28.0)
Bicarbonate: 21.7 mmol/L (ref 20.0–28.0)
Calcium, Ion: 1.04 mmol/L — ABNORMAL LOW (ref 1.15–1.40)
Calcium, Ion: 1.09 mmol/L — ABNORMAL LOW (ref 1.15–1.40)
Calcium, Ion: 1.1 mmol/L — ABNORMAL LOW (ref 1.15–1.40)
Calcium, Ion: 1.11 mmol/L — ABNORMAL LOW (ref 1.15–1.40)
Calcium, Ion: 1.15 mmol/L (ref 1.15–1.40)
Calcium, Ion: 1.17 mmol/L (ref 1.15–1.40)
HCT: 30 % — ABNORMAL LOW (ref 39.0–52.0)
HCT: 31 % — ABNORMAL LOW (ref 39.0–52.0)
HCT: 32 % — ABNORMAL LOW (ref 39.0–52.0)
HCT: 32 % — ABNORMAL LOW (ref 39.0–52.0)
HCT: 34 % — ABNORMAL LOW (ref 39.0–52.0)
HCT: 34 % — ABNORMAL LOW (ref 39.0–52.0)
Hemoglobin: 10.2 g/dL — ABNORMAL LOW (ref 13.0–17.0)
Hemoglobin: 10.5 g/dL — ABNORMAL LOW (ref 13.0–17.0)
Hemoglobin: 10.9 g/dL — ABNORMAL LOW (ref 13.0–17.0)
Hemoglobin: 10.9 g/dL — ABNORMAL LOW (ref 13.0–17.0)
Hemoglobin: 11.6 g/dL — ABNORMAL LOW (ref 13.0–17.0)
Hemoglobin: 11.6 g/dL — ABNORMAL LOW (ref 13.0–17.0)
O2 Saturation: 100 %
O2 Saturation: 100 %
O2 Saturation: 100 %
O2 Saturation: 100 %
O2 Saturation: 100 %
O2 Saturation: 100 %
Patient temperature: 35.5
Patient temperature: 35.9
Patient temperature: 36
Patient temperature: 36.2
Patient temperature: 36.6
Patient temperature: 36.7
Potassium: 3 mmol/L — ABNORMAL LOW (ref 3.5–5.1)
Potassium: 3.2 mmol/L — ABNORMAL LOW (ref 3.5–5.1)
Potassium: 3.3 mmol/L — ABNORMAL LOW (ref 3.5–5.1)
Potassium: 3.4 mmol/L — ABNORMAL LOW (ref 3.5–5.1)
Potassium: 3.5 mmol/L (ref 3.5–5.1)
Potassium: 3.8 mmol/L (ref 3.5–5.1)
Sodium: 139 mmol/L (ref 135–145)
Sodium: 140 mmol/L (ref 135–145)
Sodium: 143 mmol/L (ref 135–145)
Sodium: 146 mmol/L — ABNORMAL HIGH (ref 135–145)
Sodium: 147 mmol/L — ABNORMAL HIGH (ref 135–145)
Sodium: 149 mmol/L — ABNORMAL HIGH (ref 135–145)
TCO2: 20 mmol/L — ABNORMAL LOW (ref 22–32)
TCO2: 21 mmol/L — ABNORMAL LOW (ref 22–32)
TCO2: 22 mmol/L (ref 22–32)
TCO2: 22 mmol/L (ref 22–32)
TCO2: 22 mmol/L (ref 22–32)
TCO2: 23 mmol/L (ref 22–32)
pCO2 arterial: 24.2 mmHg — ABNORMAL LOW (ref 32–48)
pCO2 arterial: 26.8 mmHg — ABNORMAL LOW (ref 32–48)
pCO2 arterial: 26.9 mmHg — ABNORMAL LOW (ref 32–48)
pCO2 arterial: 28.4 mmHg — ABNORMAL LOW (ref 32–48)
pCO2 arterial: 30.5 mmHg — ABNORMAL LOW (ref 32–48)
pCO2 arterial: 37.9 mmHg (ref 32–48)
pH, Arterial: 7.364 (ref 7.35–7.45)
pH, Arterial: 7.453 — ABNORMAL HIGH (ref 7.35–7.45)
pH, Arterial: 7.461 — ABNORMAL HIGH (ref 7.35–7.45)
pH, Arterial: 7.479 — ABNORMAL HIGH (ref 7.35–7.45)
pH, Arterial: 7.486 — ABNORMAL HIGH (ref 7.35–7.45)
pH, Arterial: 7.541 — ABNORMAL HIGH (ref 7.35–7.45)
pO2, Arterial: 433 mmHg — ABNORMAL HIGH (ref 83–108)
pO2, Arterial: 463 mmHg — ABNORMAL HIGH (ref 83–108)
pO2, Arterial: 486 mmHg — ABNORMAL HIGH (ref 83–108)
pO2, Arterial: 516 mmHg — ABNORMAL HIGH (ref 83–108)
pO2, Arterial: 527 mmHg — ABNORMAL HIGH (ref 83–108)
pO2, Arterial: 543 mmHg — ABNORMAL HIGH (ref 83–108)

## 2023-11-16 LAB — PHOSPHORUS
Phosphorus: 5.3 mg/dL — ABNORMAL HIGH (ref 2.5–4.6)
Phosphorus: 4.4 mg/dL (ref 2.5–4.6)
Phosphorus: 2.7 mg/dL (ref 2.5–4.6)
Phosphorus: 2.7 mg/dL (ref 2.5–4.6)

## 2023-11-16 LAB — URINALYSIS, ROUTINE W REFLEX MICROSCOPIC
Bilirubin Urine: NEGATIVE
Glucose, UA: NEGATIVE mg/dL
Ketones, ur: NEGATIVE mg/dL
Leukocytes,Ua: NEGATIVE
Nitrite: NEGATIVE
Protein, ur: NEGATIVE mg/dL
Specific Gravity, Urine: 1.01 (ref 1.005–1.030)
pH: 5 (ref 5.0–8.0)

## 2023-11-16 LAB — COMPREHENSIVE METABOLIC PANEL WITH GFR
ALT: 66 U/L — ABNORMAL HIGH (ref 0–44)
ALT: 61 U/L — ABNORMAL HIGH (ref 0–44)
ALT: 60 U/L — ABNORMAL HIGH (ref 0–44)
ALT: 55 U/L — ABNORMAL HIGH (ref 0–44)
AST: 52 U/L — ABNORMAL HIGH (ref 15–41)
AST: 46 U/L — ABNORMAL HIGH (ref 15–41)
AST: 43 U/L — ABNORMAL HIGH (ref 15–41)
AST: 39 U/L (ref 15–41)
Albumin: 2.5 g/dL — ABNORMAL LOW (ref 3.5–5.0)
Albumin: 2.5 g/dL — ABNORMAL LOW (ref 3.5–5.0)
Albumin: 2.6 g/dL — ABNORMAL LOW (ref 3.5–5.0)
Albumin: 2.6 g/dL — ABNORMAL LOW (ref 3.5–5.0)
Alkaline Phosphatase: 55 U/L (ref 38–126)
Alkaline Phosphatase: 55 U/L (ref 38–126)
Alkaline Phosphatase: 70 U/L (ref 38–126)
Alkaline Phosphatase: 67 U/L (ref 38–126)
Anion gap: 14 (ref 5–15)
Anion gap: 15 (ref 5–15)
Anion gap: 18 — ABNORMAL HIGH (ref 5–15)
Anion gap: 16 — ABNORMAL HIGH (ref 5–15)
BUN: 58 mg/dL — ABNORMAL HIGH (ref 6–20)
BUN: 59 mg/dL — ABNORMAL HIGH (ref 6–20)
BUN: 60 mg/dL — ABNORMAL HIGH (ref 6–20)
BUN: 61 mg/dL — ABNORMAL HIGH (ref 6–20)
CO2: 20 mmol/L — ABNORMAL LOW (ref 22–32)
CO2: 20 mmol/L — ABNORMAL LOW (ref 22–32)
CO2: 19 mmol/L — ABNORMAL LOW (ref 22–32)
CO2: 19 mmol/L — ABNORMAL LOW (ref 22–32)
Calcium: 7.6 mg/dL — ABNORMAL LOW (ref 8.9–10.3)
Calcium: 7.9 mg/dL — ABNORMAL LOW (ref 8.9–10.3)
Calcium: 8.1 mg/dL — ABNORMAL LOW (ref 8.9–10.3)
Calcium: 8.3 mg/dL — ABNORMAL LOW (ref 8.9–10.3)
Chloride: 104 mmol/L (ref 98–111)
Chloride: 104 mmol/L (ref 98–111)
Chloride: 108 mmol/L (ref 98–111)
Chloride: 112 mmol/L — ABNORMAL HIGH (ref 98–111)
Creatinine, Ser: 3.47 mg/dL — ABNORMAL HIGH (ref 0.61–1.24)
Creatinine, Ser: 3.33 mg/dL — ABNORMAL HIGH (ref 0.61–1.24)
Creatinine, Ser: 3.16 mg/dL — ABNORMAL HIGH (ref 0.61–1.24)
Creatinine, Ser: 2.97 mg/dL — ABNORMAL HIGH (ref 0.61–1.24)
GFR, Estimated: 22 mL/min — ABNORMAL LOW (ref >60)
GFR, Estimated: 23 mL/min — ABNORMAL LOW (ref >60)
GFR, Estimated: 25 mL/min — ABNORMAL LOW (ref >60)
GFR, Estimated: 26 mL/min — ABNORMAL LOW (ref >60)
Glucose, Bld: 162 mg/dL — ABNORMAL HIGH (ref 70–99)
Glucose, Bld: 144 mg/dL — ABNORMAL HIGH (ref 70–99)
Glucose, Bld: 164 mg/dL — ABNORMAL HIGH (ref 70–99)
Glucose, Bld: 159 mg/dL — ABNORMAL HIGH (ref 70–99)
Potassium: 3.9 mmol/L (ref 3.5–5.1)
Potassium: 3.5 mmol/L (ref 3.5–5.1)
Potassium: 3.5 mmol/L (ref 3.5–5.1)
Potassium: 3.3 mmol/L — ABNORMAL LOW (ref 3.5–5.1)
Sodium: 138 mmol/L (ref 135–145)
Sodium: 139 mmol/L (ref 135–145)
Sodium: 145 mmol/L (ref 135–145)
Sodium: 147 mmol/L — ABNORMAL HIGH (ref 135–145)
Total Bilirubin: 1 mg/dL (ref 0.0–1.2)
Total Bilirubin: 1 mg/dL (ref 0.0–1.2)
Total Bilirubin: 1 mg/dL (ref 0.0–1.2)
Total Bilirubin: 1 mg/dL (ref 0.0–1.2)
Total Protein: 5.7 g/dL — ABNORMAL LOW (ref 6.5–8.1)
Total Protein: 5.7 g/dL — ABNORMAL LOW (ref 6.5–8.1)
Total Protein: 6.2 g/dL — ABNORMAL LOW (ref 6.5–8.1)
Total Protein: 6.4 g/dL — ABNORMAL LOW (ref 6.5–8.1)

## 2023-11-16 LAB — ECHOCARDIOGRAM COMPLETE
AR max vel: 3.77 cm2
AV Area VTI: 3.52 cm2
AV Area mean vel: 3.31 cm2
AV Mean grad: 4 mmHg
AV Peak grad: 5.8 mmHg
Ao pk vel: 1.2 m/s
Area-P 1/2: 3.6 cm2
Calc EF: 59.5 %
Height: 76 in
S' Lateral: 3.2 cm
Single Plane A2C EF: 60.7 %
Single Plane A4C EF: 56.7 %
Weight: 3252.23 [oz_av]

## 2023-11-16 LAB — GLUCOSE, CAPILLARY
Glucose-Capillary: 155 mg/dL — ABNORMAL HIGH (ref 70–99)
Glucose-Capillary: 147 mg/dL — ABNORMAL HIGH (ref 70–99)
Glucose-Capillary: 169 mg/dL — ABNORMAL HIGH (ref 70–99)
Glucose-Capillary: 158 mg/dL — ABNORMAL HIGH (ref 70–99)
Glucose-Capillary: 131 mg/dL — ABNORMAL HIGH (ref 70–99)

## 2023-11-16 LAB — CBC
HCT: 35.6 % — ABNORMAL LOW (ref 39.0–52.0)
HCT: 34.7 % — ABNORMAL LOW (ref 39.0–52.0)
HCT: 35.2 % — ABNORMAL LOW (ref 39.0–52.0)
HCT: 34.7 % — ABNORMAL LOW (ref 39.0–52.0)
Hemoglobin: 11.9 g/dL — ABNORMAL LOW (ref 13.0–17.0)
Hemoglobin: 11.6 g/dL — ABNORMAL LOW (ref 13.0–17.0)
Hemoglobin: 11.8 g/dL — ABNORMAL LOW (ref 13.0–17.0)
Hemoglobin: 11.7 g/dL — ABNORMAL LOW (ref 13.0–17.0)
MCH: 32.7 pg (ref 26.0–34.0)
MCH: 32.6 pg (ref 26.0–34.0)
MCH: 32.4 pg (ref 26.0–34.0)
MCH: 32.5 pg (ref 26.0–34.0)
MCHC: 33.4 g/dL (ref 30.0–36.0)
MCHC: 33.4 g/dL (ref 30.0–36.0)
MCHC: 33.5 g/dL (ref 30.0–36.0)
MCHC: 33.7 g/dL (ref 30.0–36.0)
MCV: 97.8 fL (ref 80.0–100.0)
MCV: 97.5 fL (ref 80.0–100.0)
MCV: 96.7 fL (ref 80.0–100.0)
MCV: 96.4 fL (ref 80.0–100.0)
Platelets: 118 K/uL — ABNORMAL LOW (ref 150–400)
Platelets: 112 K/uL — ABNORMAL LOW (ref 150–400)
Platelets: 121 K/uL — ABNORMAL LOW (ref 150–400)
Platelets: 119 K/uL — ABNORMAL LOW (ref 150–400)
RBC: 3.64 MIL/uL — ABNORMAL LOW (ref 4.22–5.81)
RBC: 3.56 MIL/uL — ABNORMAL LOW (ref 4.22–5.81)
RBC: 3.64 MIL/uL — ABNORMAL LOW (ref 4.22–5.81)
RBC: 3.6 MIL/uL — ABNORMAL LOW (ref 4.22–5.81)
RDW: 13.4 % (ref 11.5–15.5)
RDW: 13.5 % (ref 11.5–15.5)
RDW: 13.4 % (ref 11.5–15.5)
RDW: 13.3 % (ref 11.5–15.5)
WBC: 19.8 K/uL — ABNORMAL HIGH (ref 4.0–10.5)
WBC: 18.2 K/uL — ABNORMAL HIGH (ref 4.0–10.5)
WBC: 20.2 K/uL — ABNORMAL HIGH (ref 4.0–10.5)
WBC: 18.3 K/uL — ABNORMAL HIGH (ref 4.0–10.5)
nRBC: 0 % (ref 0.0–0.2)
nRBC: 0 % (ref 0.0–0.2)
nRBC: 0 % (ref 0.0–0.2)
nRBC: 0 % (ref 0.0–0.2)

## 2023-11-16 LAB — PROTIME-INR
INR: 1.1 (ref 0.8–1.2)
INR: 1 (ref 0.8–1.2)
INR: 1 (ref 0.8–1.2)
INR: 1.1 (ref 0.8–1.2)
Prothrombin Time: 14.6 s (ref 11.4–15.2)
Prothrombin Time: 14.2 s (ref 11.4–15.2)
Prothrombin Time: 14.2 s (ref 11.4–15.2)
Prothrombin Time: 14.5 s (ref 11.4–15.2)

## 2023-11-16 LAB — LIPASE, BLOOD: Lipase: 39 U/L (ref 11–51)

## 2023-11-16 LAB — APTT
aPTT: 25 s (ref 24–36)
aPTT: 25 s (ref 24–36)
aPTT: 27 s (ref 24–36)
aPTT: 28 s (ref 24–36)

## 2023-11-16 LAB — MAGNESIUM
Magnesium: 2.1 mg/dL (ref 1.7–2.4)
Magnesium: 2.3 mg/dL (ref 1.7–2.4)
Magnesium: 2.6 mg/dL — ABNORMAL HIGH (ref 1.7–2.4)
Magnesium: 2.8 mg/dL — ABNORMAL HIGH (ref 1.7–2.4)

## 2023-11-16 LAB — CK TOTAL AND CKMB (NOT AT ARMC)
CK, MB: 14 ng/mL — ABNORMAL HIGH (ref 0.5–5.0)
CK, MB: 14.4 ng/mL — ABNORMAL HIGH (ref 0.5–5.0)
Total CK: 550 U/L — ABNORMAL HIGH (ref 49–397)
Total CK: 526 U/L — ABNORMAL HIGH (ref 49–397)

## 2023-11-16 LAB — LACTIC ACID, PLASMA
Lactic Acid, Venous: 0.9 mmol/L (ref 0.5–1.9)
Lactic Acid, Venous: 1 mmol/L (ref 0.5–1.9)
Lactic Acid, Venous: 1.1 mmol/L (ref 0.5–1.9)
Lactic Acid, Venous: 1 mmol/L (ref 0.5–1.9)

## 2023-11-16 LAB — BILIRUBIN, DIRECT
Bilirubin, Direct: 0.1 mg/dL (ref 0.0–0.2)
Bilirubin, Direct: 0.1 mg/dL (ref 0.0–0.2)
Bilirubin, Direct: 0.1 mg/dL (ref 0.0–0.2)
Bilirubin, Direct: 0.1 mg/dL (ref 0.0–0.2)

## 2023-11-16 LAB — URINE CULTURE: Culture: NO GROWTH

## 2023-11-16 LAB — FIBRINOGEN
Fibrinogen: 657 mg/dL — ABNORMAL HIGH (ref 210–475)
Fibrinogen: 702 mg/dL — ABNORMAL HIGH (ref 210–475)
Fibrinogen: 729 mg/dL — ABNORMAL HIGH (ref 210–475)

## 2023-11-16 LAB — AMYLASE: Amylase: 404 U/L — ABNORMAL HIGH (ref 28–100)

## 2023-11-16 LAB — TROPONIN I (HIGH SENSITIVITY)
Troponin I (High Sensitivity): 1910 ng/L (ref <18)
Troponin I (High Sensitivity): 1671 ng/L (ref <18)

## 2023-11-16 LAB — CALCIUM, IONIZED: Calcium, Ionized, Serum: 4.6 mg/dL (ref 4.5–5.6)

## 2023-11-16 MED ORDER — LINEZOLID 600 MG/300ML IV SOLN
600.0000 mg | Freq: Two times a day (BID) | INTRAVENOUS | Status: DC
  Administered 2023-11-17: 600 mg via INTRAVENOUS
  Filled 2023-11-16 (×2): qty 300

## 2023-11-16 NOTE — Progress Notes (Signed)
*  PRELIMINARY RESULTS* Echocardiogram 2D Echocardiogram has been performed.  Benard FORBES Stallion 11/16/2023, 9:36 AM

## 2023-11-16 NOTE — TOC Progression Note (Signed)
 Transition of Care [facility]) - Progression Note    Patient Details  Name: Angel Scott MRN: [id number] Date of Birth: 1985-05-27  Transition of Care [facility]) CM/SW Contact  Roxie KANDICE Stain, RN Phone Number: 11/16/2023, 12:07 PM  Clinical Narrative:     Plan for patient to go to OR tomorrow for SURGICAL PROCUREMENT, ORGAN.       Expected Discharge Plan and Services                                               Social Determinants of Health (SDOH) Interventions SDOH Screenings   Food Insecurity: No Food Insecurity (06/19/2020)   Received from [facility]  Social Connections: Unknown (08/26/2021)   Received from [facility]  Tobacco Use: Low Risk  (11/13/2023)    Readmission Risk Interventions     No data to display

## 2023-11-16 NOTE — Consult Note (Signed)
 Consultation Note Date: 11/16/2023   Patient Name: Angel Scott  DOB: 07-15-1985  MRN: [id number]  Age / Sex: 38 y.o., male  PCP: Corrington, Coye LABOR, MD Referring Physician: Mannam, Praveen, MD  Reason for Consultation: Psychosocial/spiritual support  HPI/Patient Profile: 38 y.o. male  with past medical history of opiate abuse admitted on 11/13/2023 with cardiac arrest with ROSC 29 minutes after CPR initiated but with unknown downtime.   Clinical Assessment and Goals of Care: Consult received and chart review completed. I discussed with Dr. Theophilus as well as RN. Angel Scott is lying in bed and echocardiogram being performed. Off pressors. Vital signs stable. Tolerating vent support. No response but has some spontaneous breathes over vent.   I returned when Angel Scott's mother and father, Charlies and Claudene, have arrived. We met and discussed their concerns. They have already made decisions for Angel Scott and these stand firm. They tell me that they are working with [employer]. Their main concern at this time is Angel Scott's children ages 33, 73, and 40 years old. They share that they plan to speak with the children this evening to explain what is happening and what to expect. They share that they have recently learned that the children's mother has explained how sick Angel Scott is and that he may not make it so they know more than they originally thought. We reviewed the importance of having simple but honest explanations for the children as well as answering any of their questions in the same manner. We discussed visitation and that this depends on the child and if they are asking to see Angel Scott and if so making sure they understand what this visit would actually look like. I provided them with some materials regarding children and grief.   All questions/concerns addressed. Emotional support provided.   Primary Decision Maker NEXT OF KIN parents  Charlies and Claudene    SUMMARY OF RECOMMENDATIONS   - DNR - Moving towards comfort care at end of life soon   Code Status/Advance Care Planning: DNR   Symptom Management:  Per PCCM. No symptoms noted.   Prognosis:  Likely hours to days.   Discharge Planning: Anticipated Hospital Death      Primary Diagnoses: Present on Admission:  Cardiac arrest [facility])   I have reviewed the medical record, interviewed the patient and family, and examined the patient. The following aspects are pertinent.  Past Medical History:  Diagnosis Date   ADHD    GAD (generalized anxiety disorder)    Hepatitis C    Opioid dependence on agonist therapy (HCC)    Opioid use disorder    Social History   Socioeconomic History   Marital status: Single    Spouse name: Not on file   Number of children: Not on file   Years of education: Not on file   Highest education level: Not on file  Occupational History   Not on file  Tobacco Use   Smoking status: Never   Smokeless tobacco: Never  Substance and Sexual Activity   Alcohol  use:  No    Alcohol /week: 0.0 standard drinks of alcohol    Drug use: No   Sexual activity: Not on file  Other Topics Concern   Not on file  Social History Narrative   Not on file   Social Drivers of Health   Financial Resource Strain: Not on file  Food Insecurity: No Food Insecurity (06/19/2020)   Received from [facility]   Hunger Vital Sign    Within the past 12 months, you worried that your food would run out before you got the money to buy more.: Never true    Within the past 12 months, the food you bought just didn't last and you didn't have money to get more.: Never true  Transportation Needs: Not on file  Physical Activity: Not on file  Stress: Not on file  Social Connections: Unknown (08/26/2021)   Received from [facility]   Social Network    Social Network: Not on file   History reviewed. No pertinent family history. Scheduled Meds:  arformoterol   15  mcg Nebulization BID   Chlorhexidine  Gluconate Cloth  6 each Topical Daily   insulin  aspart  0-9 Units Subcutaneous Q6H   mupirocin  ointment  1 Application Nasal BID   mouth rinse  15 mL Mouth Rinse Q2H   pantoprazole  (PROTONIX ) IV  40 mg Intravenous QHS   revefenacin   175 mcg Nebulization Daily   Continuous Infusions:  ceFEPime  (MAXIPIME ) IV Stopped (11/15/23 2330)   [START ON December 02, 2023] linezolid  (ZYVOX ) IV     norepinephrine  (LEVOPHED ) Adult infusion Stopped (11/16/23 0057)   vasopressin  Stopped (11/15/23 0612)   PRN Meds:.acetaminophen , docusate, ondansetron  (ZOFRAN ) IV, mouth rinse, polyethylene glycol Allergies  Allergen Reactions   Narcan [Naloxone] Dermatitis   Review of Systems  Unable to perform ROS: Acuity of condition    Physical Exam Constitutional:      General: He is not in acute distress.    Appearance: He is ill-appearing.     Interventions: He is intubated.  Cardiovascular:     Rate and Rhythm: Tachycardia present.  Pulmonary:     Effort: No tachypnea or accessory muscle usage. He is intubated.  Abdominal:     Palpations: Abdomen is soft.  Neurological:     Mental Status: He is unresponsive.     Vital Signs: BP 117/64 (BP Location: Right Arm)   Pulse (!) 105   Temp (!) 97.5 F (36.4 C)   Resp (!) 24   Ht 6' 4 (1.93 m)   Wt 92.2 kg   SpO2 100%   BMI 24.74 kg/m  Pain Scale: CPOT       SpO2: SpO2: 100 % O2 Device:SpO2: 100 % O2 Flow Rate: .   IO: Intake/output summary:  Intake/Output Summary (Last 24 hours) at 11/16/2023 1104 Last data filed at 11/16/2023 0800 Gross per 24 hour  Intake 723.34 ml  Output 2050 ml  Net -1326.66 ml    LBM: Last BM Date : 11/16/23 Baseline Weight: Weight: 92.2 kg Most recent weight: Weight: 92.2 kg     Palliative Assessment/Data:    Time Total: 55 min  Greater than 50%  of this time was spent counseling and coordinating care related to the above assessment and plan.  Signed by: Bernarda Kitty,  NP Palliative Medicine Team Pager # [phone] (M-F 8a-5p) Team Phone # [phone] (Nights/Weekends)

## 2023-11-16 NOTE — Progress Notes (Signed)
 NAME:  Angel Scott, MRN:  [id number], DOB:  1986-02-07, LOS: 3 ADMISSION DATE:  11/13/2023, CONSULTATION DATE:  7/19 REFERRING MD:  Jaynie, CHIEF COMPLAINT:  cardiac arrest   History of Present Illness:  Mr. Angel Scott is a 39 year old gentleman with a history of opiate abuse who presented to the hospital with a cardiac arrest after being found down in a bathroom by his girlfriend. He had been down an unknown amount of time. When the emergency responders arrived he was apneic, pulseless, asystole. CPR was initiated at that time. He received 4 doses of epinephrine  and 2 doses of Narcan in the field. ROSC was achieved 29 minutes after the start of chest compressions. An IO and 600 endotracheal tube were placed in the field. Upon arrival to the ER he was not taking any spontaneous respirations, relying on bag-valve-mask. He was connected to mechanical ventilation. He was initially hypertensive but subsequently required vasopressors-norepinephrine  and vasopressin  have been started. PCCM consulted for admission.   Pertinent  Medical History  Opiate abuse HCV ADHD GAD  Significant Hospital Events: Including procedures, antibiotic start and stop dates in addition to other pertinent events   7/19 cardiac arrest, admitted  7/21 Discussion with family, code status DNR with eventual plans for comfort care and organ donation, but awaiting arrival of pts brother from [place]   Interim History / Subjective:  Unresponsive. No acute events overnight.  Objective    Blood pressure 117/64, pulse (!) 107, temperature 98.2 F (36.8 C), resp. rate (!) 24, height 6' 4 (1.93 m), weight 92.2 kg, SpO2 100%.    Vent Mode: PRVC FiO2 (%):  [40 %-60 %] 60 % Set Rate:  [24 bmp-28 bmp] 24 bmp Vt Set:  [550 mL-660 mL] 550 mL PEEP:  [5 cmH20-10 cmH20] 5 cmH20 Plateau Pressure:  [13 cmH20-19 cmH20] 13 cmH20   Intake/Output Summary (Last 24 hours) at 11/16/2023 0844 Last data filed at 11/16/2023 0800 Gross per  24 hour  Intake 740.22 ml  Output 2050 ml  Net -1309.78 ml   Filed Weights   11/15/23 0351 11/16/23 0253  Weight: 92.2 kg 92.2 kg    Examination: Gen:        No acute distress HEENT:  EOMI, sclera anicteric, ETT Neck:     No masses; no thyromegaly Lungs:    Clear to auscultation bilaterally; normal respiratory effort CV:         Regular rate and rhythm; no murmurs Abd:        + bowel sounds; soft, non-tender; no palpable masses, no distension Ext:          No edema; adequate peripheral perfusion Neuro: Comatose.  No pupillary gag cough or corneal reflex.  Has some spontaneous breaths.  Resolved problem list  Hypokalemia   Assessment and Plan   Goals of Care See yesterday's IPAL note on discussion with family. Pt currently DNR with acute care ongoing until a brother arrives from [place] to say goodbye. Thereafter plan to transition to comfort measures and pursue organ donation. - initial and q6h lab checks underway per organ donation guidelines  Cardiac arrest Suspect opiate, amphetamine overdose as the precipitating cause.  Unknown downtime with initial rhythm of asystole.  30 minutes of CPR before ROSC. Lactic acidosis due to malperfusion Troponin elevation due to chest compressions and cardiac arrest Continue supportive care  Severe anoxic brain injury Per CT, loss of sulci/basilar cisterns and concern of impending tonsillar herniation. Though brainstem reflexes are absent he has some spontaneous breaths still on  assessment this am. Lack of spontaneous breathing would prompt testing for brain death.   Shock, likely cardiogenic and distributive - resolved Vasopressors weaned overnight. Keep MAP goal > 65.  Stop stress dose steroids On empiric ceftriaxone .  Follow cultures Echocardiogram ordered, read pending   Acute respiratory failure with hypoxia and hypercapnia requiring mechanical ventilation. Secondary to above. Continue full vent support.  No plans on extubation Follow  intermittent chest x-ray   AKI due to cardiac arrest, suspect ATN Recheck electrolytes and replete phosphate Monitor urine output and creatinine   Shock liver Monitor LFTs   Hyperglycemia SSI coverage   Possible urinary tract infection Ceftriaxone   Best Practice (right click and Reselect all SmartList Selections daily)   Diet/type: tubefeeds DVT prophylaxis: SCDs Pressure ulcer(s): Defer to RN assessment GI prophylaxis: PPI Lines: Central line and Arterial Line Foley:  Yes, and it is still needed Code Status:  DNR; no family at bedside Last date of multidisciplinary goals of care discussion [7/21]  Labs   CBC: Recent Labs  Lab 11/13/23 1645 11/13/23 1700 11/14/23 0221 11/15/23 0525 11/15/23 1529 11/15/23 1554 11/15/23 2035 11/15/23 2129 11/16/23 0237 11/16/23 0243  WBC 11.1*   < > 13.7* 16.8* 19.9*  --  19.7*  --  19.8*  --   NEUTROABS 7.4  --   --   --   --   --   --   --   --   --   HGB 12.3*   < > 17.1* 15.1 14.2 13.3 13.5 12.6* 11.9* 11.6*  HCT 38.6*   < > 50.5 42.5 39.6 39.0 38.6* 37.0* 35.6* 34.0*  MCV 103.2*   < > 96.4 92.6 93.2  --  94.4  --  97.8  --   PLT 210   < > 240 166 152  --  130*  --  118*  --    < > = values in this interval not displayed.   Basic Metabolic Panel: Recent Labs  Lab 11/13/23 1645 11/13/23 1700 11/14/23 0221 11/15/23 0525 11/15/23 1529 11/15/23 1554 11/15/23 2035 11/15/23 2129 11/16/23 0237 11/16/23 0243  NA 134*   < > 134* 132* 134* 134* 136 136 138 139  K >7.5*   < > 3.2* 3.6 3.3* 3.3* 3.6 3.5 3.9 3.8  CL 100   < > 98 100 99  --  100  --  104  --   CO2 20*   < > 20* 18* 19*  --  21*  --  20*  --   GLUCOSE 277*   < > 190* 172* 176*  --  172*  --  162*  --   BUN 12   < > 24* 48* 53*  --  55*  --  58*  --   CREATININE 1.54*   < > 2.32* 3.15* 3.35*  --  3.32*  --  3.47*  --   CALCIUM  8.7*   < > 8.2* 7.7* 7.7*  --  7.6*  --  7.6*  --   MG 3.0*  --  2.4  --  2.0  --  2.1  --  2.1  --   PHOS 11.8*  --  <1.0*  --  3.3   --  4.4  --  5.3*  --    < > = values in this interval not displayed.   GFR: Estimated Creatinine Clearance: 34.7 mL/min (A) (by C-G formula based on SCr of 3.47 mg/dL (H)). Recent Labs  Lab 11/14/23 1023 11/15/23 0525  11/15/23 1529 11/15/23 2035 11/16/23 0237  WBC  --  16.8* 19.9* 19.7* 19.8*  LATICACIDVEN 3.2*  --  1.6 1.2 0.9   Liver Function Tests: Recent Labs  Lab 11/13/23 1645 11/15/23 1529 11/15/23 2035 11/16/23 0237  AST 99* 84* 74* 52*  ALT 71* 88* 83* 66*  ALKPHOS 86 63 67 55  BILITOT 0.5 0.7 0.6 1.0  PROT 6.1* 5.9* 6.0* 5.7*  ALBUMIN  2.7* 2.3* 2.3* 2.5*   No results for input(s): LIPASE, AMYLASE in the last 168 hours. No results for input(s): AMMONIA in the last 168 hours.  ABG    Component Value Date/Time   PHART 7.364 11/16/2023 0243   PCO2ART 37.9 11/16/2023 0243   PO2ART 433 (H) 11/16/2023 0243   HCO3 21.7 11/16/2023 0243   TCO2 23 11/16/2023 0243   ACIDBASEDEF 3.0 (H) 11/16/2023 0243   O2SAT 100 11/16/2023 0243    Coagulation Profile: Recent Labs  Lab 11/13/23 1645 11/15/23 1529 11/15/23 2035 11/16/23 0237  INR 1.4* 1.1 1.1 1.1   Cardiac Enzymes: Recent Labs  Lab 11/15/23 1529 11/16/23 0237  CKTOTAL 825* 550*  CKMB 21.6* 14.0*   HbA1C: Hgb A1c MFr Bld  Date/Time Value Ref Range Status  11/13/2023 08:55 PM 5.1 4.8 - 5.6 % Final    Comment:    (NOTE) Diagnosis of Diabetes The following HbA1c ranges recommended by the American Diabetes Association (ADA) may be used as an aid in the diagnosis of diabetes mellitus.  Hemoglobin             Suggested A1C NGSP%              Diagnosis  <5.7                   Non Diabetic  5.7-6.4                Pre-Diabetic  >6.4                   Diabetic  <7.0                   Glycemic control for                       adults with diabetes.     CBG: Recent Labs  Lab 11/15/23 1111 11/15/23 1543 11/15/23 1806 11/15/23 2327 11/16/23 0535  GLUCAP 136* 181* 180* 171* 155*    Review of Systems:   Non responsive patient. Negative ROS.  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   Surgical History:  History reviewed. No pertinent surgical history.   Social History:   reports that he has never smoked. He has never used smokeless tobacco. He reports that he does not drink alcohol  and does not use drugs.   Family History:  His family history is not on file.   Allergies Allergies  Allergen Reactions   Narcan [Naloxone] Dermatitis    Home Medications  Prior to Admission medications   Not on File    Critical care time: 35 mins    Lonni Africa IM Resident PGY-2

## 2023-11-17 ENCOUNTER — Encounter (HOSPITAL_COMMUNITY): Admission: EM | Disposition: E | Payer: Self-pay | Source: Home / Self Care | Attending: Pulmonary Disease

## 2023-11-17 ENCOUNTER — Inpatient Hospital Stay (HOSPITAL_COMMUNITY): Payer: Self-pay | Admitting: Anesthesiology

## 2023-11-17 ENCOUNTER — Inpatient Hospital Stay (HOSPITAL_COMMUNITY)

## 2023-11-17 DIAGNOSIS — R579 Shock, unspecified: Secondary | ICD-10-CM

## 2023-11-17 DIAGNOSIS — J9600 Acute respiratory failure, unspecified whether with hypoxia or hypercapnia: Secondary | ICD-10-CM

## 2023-11-17 DIAGNOSIS — I469 Cardiac arrest, cause unspecified: Secondary | ICD-10-CM | POA: Diagnosis not present

## 2023-11-17 DIAGNOSIS — Z7189 Other specified counseling: Secondary | ICD-10-CM

## 2023-11-17 DIAGNOSIS — Z515 Encounter for palliative care: Secondary | ICD-10-CM

## 2023-11-17 DIAGNOSIS — G9382 Brain death: Secondary | ICD-10-CM

## 2023-11-17 DIAGNOSIS — G931 Anoxic brain damage, not elsewhere classified: Secondary | ICD-10-CM | POA: Diagnosis not present

## 2023-11-17 DIAGNOSIS — F418 Other specified anxiety disorders: Secondary | ICD-10-CM

## 2023-11-17 HISTORY — PX: ORGAN PROCUREMENT: SHX5270

## 2023-11-17 LAB — PHOSPHORUS
Phosphorus: 2.3 mg/dL — ABNORMAL LOW (ref 2.5–4.6)
Phosphorus: 1.6 mg/dL — ABNORMAL LOW (ref 2.5–4.6)
Phosphorus: 1.3 mg/dL — ABNORMAL LOW (ref 2.5–4.6)

## 2023-11-17 LAB — COMPREHENSIVE METABOLIC PANEL WITH GFR
ALT: 52 U/L — ABNORMAL HIGH (ref 0–44)
ALT: 48 U/L — ABNORMAL HIGH (ref 0–44)
ALT: 39 U/L (ref 0–44)
AST: 34 U/L (ref 15–41)
AST: 32 U/L (ref 15–41)
AST: 28 U/L (ref 15–41)
Albumin: 2.5 g/dL — ABNORMAL LOW (ref 3.5–5.0)
Albumin: 2.4 g/dL — ABNORMAL LOW (ref 3.5–5.0)
Albumin: 2.2 g/dL — ABNORMAL LOW (ref 3.5–5.0)
Alkaline Phosphatase: 91 U/L (ref 38–126)
Alkaline Phosphatase: 67 U/L (ref 38–126)
Alkaline Phosphatase: 62 U/L (ref 38–126)
Anion gap: 13 (ref 5–15)
Anion gap: 14 (ref 5–15)
Anion gap: 13 (ref 5–15)
BUN: 62 mg/dL — ABNORMAL HIGH (ref 6–20)
BUN: 65 mg/dL — ABNORMAL HIGH (ref 6–20)
BUN: 69 mg/dL — ABNORMAL HIGH (ref 6–20)
CO2: 21 mmol/L — ABNORMAL LOW (ref 22–32)
CO2: 22 mmol/L (ref 22–32)
CO2: 21 mmol/L — ABNORMAL LOW (ref 22–32)
Calcium: 8.5 mg/dL — ABNORMAL LOW (ref 8.9–10.3)
Calcium: 8.7 mg/dL — ABNORMAL LOW (ref 8.9–10.3)
Calcium: 8.4 mg/dL — ABNORMAL LOW (ref 8.9–10.3)
Chloride: 117 mmol/L — ABNORMAL HIGH (ref 98–111)
Chloride: 117 mmol/L — ABNORMAL HIGH (ref 98–111)
Chloride: 116 mmol/L — ABNORMAL HIGH (ref 98–111)
Creatinine, Ser: 2.98 mg/dL — ABNORMAL HIGH (ref 0.61–1.24)
Creatinine, Ser: 3.1 mg/dL — ABNORMAL HIGH (ref 0.61–1.24)
Creatinine, Ser: 3.18 mg/dL — ABNORMAL HIGH (ref 0.61–1.24)
GFR, Estimated: 26 mL/min — ABNORMAL LOW (ref >60)
GFR, Estimated: 25 mL/min — ABNORMAL LOW (ref >60)
GFR, Estimated: 24 mL/min — ABNORMAL LOW (ref >60)
Glucose, Bld: 135 mg/dL — ABNORMAL HIGH (ref 70–99)
Glucose, Bld: 135 mg/dL — ABNORMAL HIGH (ref 70–99)
Glucose, Bld: 121 mg/dL — ABNORMAL HIGH (ref 70–99)
Potassium: 3.1 mmol/L — ABNORMAL LOW (ref 3.5–5.1)
Potassium: 3.3 mmol/L — ABNORMAL LOW (ref 3.5–5.1)
Potassium: 3.7 mmol/L (ref 3.5–5.1)
Sodium: 151 mmol/L — ABNORMAL HIGH (ref 135–145)
Sodium: 153 mmol/L — ABNORMAL HIGH (ref 135–145)
Sodium: 150 mmol/L — ABNORMAL HIGH (ref 135–145)
Total Bilirubin: 0.8 mg/dL (ref 0.0–1.2)
Total Bilirubin: 0.8 mg/dL (ref 0.0–1.2)
Total Bilirubin: 0.9 mg/dL (ref 0.0–1.2)
Total Protein: 6.4 g/dL — ABNORMAL LOW (ref 6.5–8.1)
Total Protein: 6.4 g/dL — ABNORMAL LOW (ref 6.5–8.1)
Total Protein: 5.9 g/dL — ABNORMAL LOW (ref 6.5–8.1)

## 2023-11-17 LAB — CBC
HCT: 34 % — ABNORMAL LOW (ref 39.0–52.0)
HCT: 34.3 % — ABNORMAL LOW (ref 39.0–52.0)
HCT: 32.4 % — ABNORMAL LOW (ref 39.0–52.0)
Hemoglobin: 11.4 g/dL — ABNORMAL LOW (ref 13.0–17.0)
Hemoglobin: 11.4 g/dL — ABNORMAL LOW (ref 13.0–17.0)
Hemoglobin: 10.6 g/dL — ABNORMAL LOW (ref 13.0–17.0)
MCH: 32.8 pg (ref 26.0–34.0)
MCH: 32.6 pg (ref 26.0–34.0)
MCH: 32.6 pg (ref 26.0–34.0)
MCHC: 33.5 g/dL (ref 30.0–36.0)
MCHC: 33.2 g/dL (ref 30.0–36.0)
MCHC: 32.7 g/dL (ref 30.0–36.0)
MCV: 97.7 fL (ref 80.0–100.0)
MCV: 98 fL (ref 80.0–100.0)
MCV: 99.7 fL (ref 80.0–100.0)
Platelets: 113 K/uL — ABNORMAL LOW (ref 150–400)
Platelets: 112 K/uL — ABNORMAL LOW (ref 150–400)
Platelets: 118 K/uL — ABNORMAL LOW (ref 150–400)
RBC: 3.48 MIL/uL — ABNORMAL LOW (ref 4.22–5.81)
RBC: 3.5 MIL/uL — ABNORMAL LOW (ref 4.22–5.81)
RBC: 3.25 MIL/uL — ABNORMAL LOW (ref 4.22–5.81)
RDW: 13.7 % (ref 11.5–15.5)
RDW: 13.8 % (ref 11.5–15.5)
RDW: 13.9 % (ref 11.5–15.5)
WBC: 16.3 K/uL — ABNORMAL HIGH (ref 4.0–10.5)
WBC: 12.5 K/uL — ABNORMAL HIGH (ref 4.0–10.5)
WBC: 11.7 K/uL — ABNORMAL HIGH (ref 4.0–10.5)
nRBC: 0 % (ref 0.0–0.2)
nRBC: 0 % (ref 0.0–0.2)
nRBC: 0 % (ref 0.0–0.2)

## 2023-11-17 LAB — POCT I-STAT 7, (LYTES, BLD GAS, ICA,H+H)
Acid-Base Excess: 0 mmol/L (ref 0.0–2.0)
Acid-Base Excess: 0 mmol/L (ref 0.0–2.0)
Acid-base deficit: 1 mmol/L (ref 0.0–2.0)
Acid-base deficit: 2 mmol/L (ref 0.0–2.0)
Bicarbonate: 24.2 mmol/L (ref 20.0–28.0)
Bicarbonate: 23.9 mmol/L (ref 20.0–28.0)
Bicarbonate: 23.7 mmol/L (ref 20.0–28.0)
Bicarbonate: 24.4 mmol/L (ref 20.0–28.0)
Calcium, Ion: 1.18 mmol/L (ref 1.15–1.40)
Calcium, Ion: 1.23 mmol/L (ref 1.15–1.40)
Calcium, Ion: 1.24 mmol/L (ref 1.15–1.40)
Calcium, Ion: 1.21 mmol/L (ref 1.15–1.40)
HCT: 31 % — ABNORMAL LOW (ref 39.0–52.0)
HCT: 32 % — ABNORMAL LOW (ref 39.0–52.0)
HCT: 29 % — ABNORMAL LOW (ref 39.0–52.0)
HCT: 27 % — ABNORMAL LOW (ref 39.0–52.0)
Hemoglobin: 10.5 g/dL — ABNORMAL LOW (ref 13.0–17.0)
Hemoglobin: 10.9 g/dL — ABNORMAL LOW (ref 13.0–17.0)
Hemoglobin: 9.9 g/dL — ABNORMAL LOW (ref 13.0–17.0)
Hemoglobin: 9.2 g/dL — ABNORMAL LOW (ref 13.0–17.0)
O2 Saturation: 100 %
O2 Saturation: 95 %
O2 Saturation: 96 %
O2 Saturation: 99 %
Patient temperature: 37.5
Patient temperature: 97.5
Patient temperature: 36.9
Patient temperature: 98.8
Potassium: 3.1 mmol/L — ABNORMAL LOW (ref 3.5–5.1)
Potassium: 3.3 mmol/L — ABNORMAL LOW (ref 3.5–5.1)
Potassium: 3.5 mmol/L (ref 3.5–5.1)
Potassium: 3.7 mmol/L (ref 3.5–5.1)
Sodium: 152 mmol/L — ABNORMAL HIGH (ref 135–145)
Sodium: 154 mmol/L — ABNORMAL HIGH (ref 135–145)
Sodium: 153 mmol/L — ABNORMAL HIGH (ref 135–145)
Sodium: 153 mmol/L — ABNORMAL HIGH (ref 135–145)
TCO2: 25 mmol/L (ref 22–32)
TCO2: 25 mmol/L (ref 22–32)
TCO2: 25 mmol/L (ref 22–32)
TCO2: 26 mmol/L (ref 22–32)
pCO2 arterial: 38.6 mmHg (ref 32–48)
pCO2 arterial: 35.3 mmHg (ref 32–48)
pCO2 arterial: 37.2 mmHg (ref 32–48)
pCO2 arterial: 48 mmHg (ref 32–48)
pH, Arterial: 7.407 (ref 7.35–7.45)
pH, Arterial: 7.436 (ref 7.35–7.45)
pH, Arterial: 7.412 (ref 7.35–7.45)
pH, Arterial: 7.315 — ABNORMAL LOW (ref 7.35–7.45)
pO2, Arterial: 496 mmHg — ABNORMAL HIGH (ref 83–108)
pO2, Arterial: 73 mmHg — ABNORMAL LOW (ref 83–108)
pO2, Arterial: 83 mmHg (ref 83–108)
pO2, Arterial: 172 mmHg — ABNORMAL HIGH (ref 83–108)

## 2023-11-17 LAB — URINALYSIS, ROUTINE W REFLEX MICROSCOPIC
Bilirubin Urine: NEGATIVE
Glucose, UA: NEGATIVE mg/dL
Ketones, ur: NEGATIVE mg/dL
Leukocytes,Ua: NEGATIVE
Nitrite: NEGATIVE
Protein, ur: 30 mg/dL — AB
Specific Gravity, Urine: 1.01 (ref 1.005–1.030)
pH: 5 (ref 5.0–8.0)

## 2023-11-17 LAB — CULTURE, RESPIRATORY W GRAM STAIN

## 2023-11-17 LAB — BASIC METABOLIC PANEL WITH GFR
Anion gap: 13 (ref 5–15)
BUN: 61 mg/dL — ABNORMAL HIGH (ref 6–20)
CO2: 21 mmol/L — ABNORMAL LOW (ref 22–32)
Calcium: 8.3 mg/dL — ABNORMAL LOW (ref 8.9–10.3)
Chloride: 117 mmol/L — ABNORMAL HIGH (ref 98–111)
Creatinine, Ser: 2.94 mg/dL — ABNORMAL HIGH (ref 0.61–1.24)
GFR, Estimated: 27 mL/min — ABNORMAL LOW (ref >60)
Glucose, Bld: 136 mg/dL — ABNORMAL HIGH (ref 70–99)
Potassium: 3.1 mmol/L — ABNORMAL LOW (ref 3.5–5.1)
Sodium: 151 mmol/L — ABNORMAL HIGH (ref 135–145)

## 2023-11-17 LAB — GLUCOSE, CAPILLARY
Glucose-Capillary: 129 mg/dL — ABNORMAL HIGH (ref 70–99)
Glucose-Capillary: 122 mg/dL — ABNORMAL HIGH (ref 70–99)
Glucose-Capillary: 119 mg/dL — ABNORMAL HIGH (ref 70–99)

## 2023-11-17 LAB — MAGNESIUM
Magnesium: 3 mg/dL — ABNORMAL HIGH (ref 1.7–2.4)
Magnesium: 3.2 mg/dL — ABNORMAL HIGH (ref 1.7–2.4)
Magnesium: 2.9 mg/dL — ABNORMAL HIGH (ref 1.7–2.4)

## 2023-11-17 LAB — PREPARE RBC (CROSSMATCH)

## 2023-11-17 LAB — CK TOTAL AND CKMB (NOT AT ARMC)
CK, MB: 13 ng/mL — ABNORMAL HIGH (ref 0.5–5.0)
CK, MB: 7.6 ng/mL — ABNORMAL HIGH (ref 0.5–5.0)
Total CK: 400 U/L — ABNORMAL HIGH (ref 49–397)
Total CK: 372 U/L (ref 49–397)

## 2023-11-17 LAB — PROTIME-INR
INR: 1.1 (ref 0.8–1.2)
INR: 1.1 (ref 0.8–1.2)
INR: 1.2 (ref 0.8–1.2)
Prothrombin Time: 14.4 s (ref 11.4–15.2)
Prothrombin Time: 15 s (ref 11.4–15.2)
Prothrombin Time: 16 s — ABNORMAL HIGH (ref 11.4–15.2)

## 2023-11-17 LAB — BILIRUBIN, DIRECT
Bilirubin, Direct: <0.1 mg/dL (ref 0.0–0.2)
Bilirubin, Direct: <0.1 mg/dL (ref 0.0–0.2)
Bilirubin, Direct: 0.2 mg/dL (ref 0.0–0.2)

## 2023-11-17 LAB — LACTIC ACID, PLASMA
Lactic Acid, Venous: 1 mmol/L (ref 0.5–1.9)
Lactic Acid, Venous: 1.4 mmol/L (ref 0.5–1.9)
Lactic Acid, Venous: 1.9 mmol/L (ref 0.5–1.9)

## 2023-11-17 LAB — FIBRINOGEN
Fibrinogen: >800 mg/dL — ABNORMAL HIGH (ref 210–475)
Fibrinogen: 771 mg/dL — ABNORMAL HIGH (ref 210–475)
Fibrinogen: 780 mg/dL — ABNORMAL HIGH (ref 210–475)
Fibrinogen: 775 mg/dL — ABNORMAL HIGH (ref 210–475)

## 2023-11-17 LAB — CALCIUM, IONIZED
Calcium, Ionized, Serum: 4.4 mg/dL — ABNORMAL LOW (ref 4.5–5.6)
Calcium, Ionized, Serum: 4.2 mg/dL — ABNORMAL LOW (ref 4.5–5.6)
Calcium, Ionized, Serum: 4.3 mg/dL — ABNORMAL LOW (ref 4.5–5.6)
Calcium, Ionized, Serum: 4.6 mg/dL (ref 4.5–5.6)

## 2023-11-17 LAB — APTT
aPTT: 25 s (ref 24–36)
aPTT: 26 s (ref 24–36)
aPTT: 26 s (ref 24–36)

## 2023-11-17 LAB — TROPONIN I (HIGH SENSITIVITY)
Troponin I (High Sensitivity): 1234 ng/L (ref <18)
Troponin I (High Sensitivity): 1418 ng/L (ref <18)

## 2023-11-17 SURGERY — SURGICAL PROCUREMENT, ORGAN
Anesthesia: Monitor Anesthesia Care

## 2023-11-17 MED ORDER — HEPARIN SODIUM 10000 UNIT/ML FOR ORGAN DONATION
30000.0000 [IU] | INTRAMUSCULAR | Status: AC
  Administered 2023-11-17: 30000 [IU] via INTRAVENOUS
  Filled 2023-11-17: qty 3

## 2023-11-17 MED ORDER — SODIUM CHLORIDE 0.9 % IV SOLN: INTRAVENOUS | Status: DC

## 2023-11-17 MED ORDER — LACTATED RINGERS IV BOLUS
250.0000 mL | Freq: Once | INTRAVENOUS | Status: AC
  Administered 2023-11-17: 250 mL via INTRAVENOUS

## 2023-11-17 MED ORDER — ACETAMINOPHEN 650 MG RE SUPP: 650.0000 mg | Freq: Four times a day (QID) | RECTAL | Status: DC | PRN

## 2023-11-17 MED ORDER — LACTATED RINGERS IV SOLN
INTRAVENOUS | Status: DC | PRN
Start: 2023-11-17 — End: 2023-11-17

## 2023-11-17 MED ORDER — SODIUM CHLORIDE 0.9% IV SOLUTION: Freq: Once | INTRAVENOUS | Status: DC

## 2023-11-17 MED ORDER — MIDAZOLAM HCL 2 MG/2ML IJ SOLN: 2.0000 mg | INTRAMUSCULAR | Status: DC | PRN

## 2023-11-17 MED ORDER — LACTATED RINGERS IV SOLN: INTRAVENOUS | Status: DC | PRN

## 2023-11-17 MED ORDER — GLYCOPYRROLATE 0.2 MG/ML IJ SOLN: 0.2000 mg | INTRAMUSCULAR | Status: DC | PRN

## 2023-11-17 MED ORDER — PHENYLEPHRINE HCL-NACL 20-0.9 MG/250ML-% IV SOLN
INTRAVENOUS | Status: DC | PRN
  Administered 2023-11-17: 40 ug/min via INTRAVENOUS

## 2023-11-17 MED ORDER — GLYCOPYRROLATE 1 MG PO TABS
1.0000 mg | ORAL_TABLET | ORAL | Status: DC | PRN
Start: 2023-11-17 — End: 2023-11-18

## 2023-11-17 MED ORDER — MORPHINE 100MG IN NS 100ML (1MG/ML) PREMIX INFUSION
0.0000 mg/h | INTRAVENOUS | Status: DC
  Administered 2023-11-17: 5 mg/h via INTRAVENOUS
  Filled 2023-11-17: qty 100

## 2023-11-17 MED ORDER — POLYVINYL ALCOHOL 1.4 % OP SOLN: 1.0000 [drp] | Freq: Four times a day (QID) | OPHTHALMIC | Status: DC | PRN

## 2023-11-17 MED ORDER — 0.9 % SODIUM CHLORIDE (POUR BTL) OPTIME
TOPICAL | Status: DC | PRN
  Administered 2023-11-17: 6000 mL

## 2023-11-17 MED ORDER — ALBUMIN HUMAN 5 % IV SOLN: INTRAVENOUS | Status: DC | PRN

## 2023-11-17 MED ORDER — MORPHINE BOLUS VIA INFUSION: 5.0000 mg | INTRAVENOUS | Status: DC | PRN

## 2023-11-17 MED ORDER — HEPARIN (PORCINE) 25000 UT/250ML-% IV SOLN: 30000.0000 [IU]/h | INTRAVENOUS | Status: DC

## 2023-11-17 MED ORDER — POTASSIUM CHLORIDE 10 MEQ/50ML IV SOLN
10.0000 meq | INTRAVENOUS | Status: AC
  Administered 2023-11-17 (×4): 10 meq via INTRAVENOUS
  Filled 2023-11-17 (×4): qty 50

## 2023-11-17 MED ORDER — NITROGLYCERIN 0.4 MG SL SUBL
SUBLINGUAL_TABLET | SUBLINGUAL | Status: AC
Start: 2023-11-17 — End: 2023-11-17
  Filled 2023-11-17: qty 2

## 2023-11-17 MED ORDER — ALBUMIN HUMAN 5 % IV SOLN
12.5000 g | Freq: Once | INTRAVENOUS | Status: AC
  Administered 2023-11-17: 12.5 g via INTRAVENOUS
  Filled 2023-11-17: qty 250

## 2023-11-17 MED ORDER — PHENYLEPHRINE 80 MCG/ML (10ML) SYRINGE FOR IV PUSH (FOR BLOOD PRESSURE SUPPORT)
PREFILLED_SYRINGE | INTRAVENOUS | Status: DC | PRN
  Administered 2023-11-17: 240 ug via INTRAVENOUS
  Administered 2023-11-17: 160 ug via INTRAVENOUS

## 2023-11-17 MED ORDER — K PHOS MONO-SOD PHOS DI & MONO 155-852-130 MG PO TABS
500.0000 mg | ORAL_TABLET | Freq: Once | ORAL | Status: AC
  Administered 2023-11-17: 500 mg
  Filled 2023-11-17: qty 2

## 2023-11-17 MED ORDER — ACETAMINOPHEN 325 MG PO TABS: 650.0000 mg | ORAL_TABLET | Freq: Four times a day (QID) | ORAL | Status: DC | PRN

## 2023-11-17 MED ORDER — DEXTROSE 5 % IV SOLN: INTRAVENOUS | Status: DC

## 2023-11-17 MED ORDER — IOHEXOL 350 MG/ML SOLN
95.0000 mL | Freq: Once | INTRAVENOUS | Status: AC | PRN
  Administered 2023-11-17: 95 mL via INTRAVENOUS

## 2023-11-17 SURGICAL SUPPLY — 77 items
BAG COUNTER SPONGE SURGICOUNT (BAG) ×1 IMPLANT
BLADE CLIPPER SURG (BLADE) IMPLANT
BLADE SAW STERNAL (BLADE) ×1 IMPLANT
BLADE SURG 10 STRL SS (BLADE) IMPLANT
CLIP APPLIE 11 MED OPEN (CLIP) ×1 IMPLANT
CLIP TI LARGE 6 (CLIP) IMPLANT
CLIP TI MEDIUM 24 (CLIP) IMPLANT
CLIP TI WIDE RED SMALL 24 (CLIP) IMPLANT
CNTNR URN SCR LID CUP LEK RST (MISCELLANEOUS) ×1 IMPLANT
COVER BACK TABLE 60X90IN (DRAPES) IMPLANT
COVER MAYO STAND STRL (DRAPES) IMPLANT
COVER SURGICAL LIGHT HANDLE (MISCELLANEOUS) ×1 IMPLANT
DRAPE HALF SHEET 40X57 (DRAPES) IMPLANT
DRAPE SLUSH MACHINE 52X66 (DRAPES) ×1 IMPLANT
DRSG COVADERM 4X10 (GAUZE/BANDAGES/DRESSINGS) IMPLANT
DRSG TELFA 3X8 NADH STRL (GAUZE/BANDAGES/DRESSINGS) ×1 IMPLANT
DURAPREP 26ML APPLICATOR (WOUND CARE) IMPLANT
ELECT BLADE 6.5 EXT (BLADE) IMPLANT
ELECTRODE REM PT RTRN 9FT ADLT (ELECTROSURGICAL) ×2 IMPLANT
GAUZE 4X4 16PLY ~~LOC~~+RFID DBL (SPONGE) IMPLANT
GLOVE BIO SURGEON STRL SZ7 (GLOVE) IMPLANT
GLOVE BIO SURGEON STRL SZ7.5 (GLOVE) IMPLANT
GLOVE BIO SURGEON STRL SZ8 (GLOVE) IMPLANT
GLOVE BIO SURGEON STRL SZ8.5 (GLOVE) IMPLANT
GLOVE BIOGEL PI IND STRL 7.0 (GLOVE) IMPLANT
GLOVE BIOGEL PI IND STRL 7.5 (GLOVE) IMPLANT
GLOVE BIOGEL PI IND STRL 8 (GLOVE) IMPLANT
GLOVE BIOGEL PI IND STRL 8.5 (GLOVE) IMPLANT
GLOVE SURG SS PI 7.0 STRL IVOR (GLOVE) IMPLANT
GLOVE SURG SS PI 7.5 STRL IVOR (GLOVE) IMPLANT
GLOVE SURG SS PI 8.0 STRL IVOR (GLOVE) IMPLANT
GOWN STRL REUS W/ TWL LRG LVL3 (GOWN DISPOSABLE) ×4 IMPLANT
GOWN STRL REUS W/ TWL XL LVL3 (GOWN DISPOSABLE) ×2 IMPLANT
HANDLE SUCTION POOLE (INSTRUMENTS) IMPLANT
KIT POST MORTEM ADULT 36X90 (BAG) ×1 IMPLANT
KIT TURNOVER KIT B (KITS) ×1 IMPLANT
LOOP VASCLR MAXI BLUE 18IN ST (MISCELLANEOUS) IMPLANT
LOOPS VASCLR MAXI BLUE 18IN ST (MISCELLANEOUS) IMPLANT
MANIFOLD NEPTUNE II (INSTRUMENTS) ×1 IMPLANT
NDL BIOPSY 14X6 SOFT TISS (NEEDLE) IMPLANT
NEEDLE BIOPSY 14X6 SOFT TISS (NEEDLE) ×1 IMPLANT
NS IRRIG 1000ML POUR BTL (IV SOLUTION) IMPLANT
PACK AORTA (CUSTOM PROCEDURE TRAY) ×1 IMPLANT
PAD ARMBOARD POSITIONER FOAM (MISCELLANEOUS) ×2 IMPLANT
PENCIL BUTTON HOLSTER BLD 10FT (ELECTRODE) ×1 IMPLANT
SOL PREP POV-IOD 4OZ 10% (MISCELLANEOUS) ×2 IMPLANT
SPONGE INTESTINAL PEANUT (DISPOSABLE) IMPLANT
SPONGE T-LAP 18X18 ~~LOC~~+RFID (SPONGE) IMPLANT
STAPLER SKIN PROX 35W (STAPLE) ×1 IMPLANT
SUT BONE WAX W31G (SUTURE) IMPLANT
SUT ETHIBOND 5 LR DA (SUTURE) IMPLANT
SUT ETHILON 1 LR 30 (SUTURE) ×2 IMPLANT
SUT ETHILON 2 LR (SUTURE) IMPLANT
SUT PROLENE 3 0 SH 1 (SUTURE) IMPLANT
SUT PROLENE 4 0 SH DA (SUTURE) IMPLANT
SUT PROLENE 4-0 RB1 .5 CRCL 36 (SUTURE) IMPLANT
SUT PROLENE 5 0 C 1 24 (SUTURE) IMPLANT
SUT PROLENE 6 0 BV (SUTURE) IMPLANT
SUT SILK 0 TIES 10X30 (SUTURE) IMPLANT
SUT SILK 1 SH (SUTURE) IMPLANT
SUT SILK 1 TIES 10X30 (SUTURE) IMPLANT
SUT SILK 2 0 SH (SUTURE) IMPLANT
SUT SILK 2 0 SH CR/8 (SUTURE) IMPLANT
SUT SILK 2 0 TIES 10X30 (SUTURE) IMPLANT
SUT SILK 2-0 18XBRD TIE 12 (SUTURE) IMPLANT
SUT SILK 3 0 SH CR/8 (SUTURE) IMPLANT
SUT SILK 3 0 TIES 10X30 (SUTURE) IMPLANT
SWAB COLLECTION DEVICE MRSA (MISCELLANEOUS) IMPLANT
SWAB CULTURE ESWAB REG 1ML (MISCELLANEOUS) IMPLANT
SYR 50ML LL SCALE MARK (SYRINGE) IMPLANT
SYR 50ML SLIP (SYRINGE) IMPLANT
SYRINGE TOOMEY DISP (SYRINGE) IMPLANT
TAPE UMBILICAL 1/8 X36 TWILL (MISCELLANEOUS) IMPLANT
TUBE CONNECTING 12X1/4 (SUCTIONS) ×1 IMPLANT
VASCULAR TIE MINI RED 18IN STL (MISCELLANEOUS) IMPLANT
WATER STERILE IRR 1000ML POUR (IV SOLUTION) IMPLANT
YANKAUER SUCT BULB TIP NO VENT (SUCTIONS) ×1 IMPLANT

## 2023-11-18 ENCOUNTER — Other Ambulatory Visit: Payer: Self-pay

## 2023-11-18 ENCOUNTER — Encounter (HOSPITAL_COMMUNITY): Payer: Self-pay

## 2023-11-18 LAB — CULTURE, BLOOD (ROUTINE X 2)
Culture: NO GROWTH
Culture: NO GROWTH
Culture: NO GROWTH
Culture: NO GROWTH

## 2023-11-18 LAB — TYPE AND SCREEN
ABO/RH(D): B POS
Antibody Screen: NEGATIVE
Unit division: 0
Unit division: 0
Unit division: 0
Unit division: 0
Unit division: 0
Unit division: 0
Unit division: 0
Unit division: 0

## 2023-11-18 LAB — BPAM RBC
Blood Product Expiration Date: 202508172359
Blood Product Expiration Date: 202508172359
Blood Product Expiration Date: 202508172359
Blood Product Expiration Date: 202508172359
Blood Product Expiration Date: 202508202359
Blood Product Expiration Date: 202508202359
Blood Product Expiration Date: 202508212359
Blood Product Expiration Date: 202508212359
ISSUE DATE / TIME: 202507231959
ISSUE DATE / TIME: 202507231959
ISSUE DATE / TIME: 202507231959
ISSUE DATE / TIME: 202507231959
ISSUE DATE / TIME: 202507231959
ISSUE DATE / TIME: 202507231959
ISSUE DATE / TIME: 202507231959
ISSUE DATE / TIME: 202507231959
Unit Type and Rh: 7300
Unit Type and Rh: 7300
Unit Type and Rh: 7300
Unit Type and Rh: 7300
Unit Type and Rh: 7300
Unit Type and Rh: 7300
Unit Type and Rh: 7300
Unit Type and Rh: 7300

## 2023-11-18 LAB — CULTURE, RESPIRATORY W GRAM STAIN

## 2023-11-18 LAB — CALCIUM, IONIZED
Calcium, Ionized, Serum: 4.6 mg/dL (ref 4.5–5.6)
Calcium, Ionized, Serum: 4.8 mg/dL (ref 4.5–5.6)
Calcium, Ionized, Serum: 5 mg/dL (ref 4.5–5.6)
Calcium, Ionized, Serum: 5 mg/dL (ref 4.5–5.6)

## 2023-11-22 LAB — CULTURE, BLOOD (ROUTINE X 2): Culture: NO GROWTH

## 2023-11-22 LAB — SURGICAL PATHOLOGY

## 2023-11-23 LAB — CULTURE, BLOOD (ROUTINE X 2)
Culture: NO GROWTH
Special Requests: ADEQUATE

## 2023-11-26 NOTE — Anesthesia Procedure Notes (Signed)
 Procedure Name: Intubation Date/Time: 2023/12/12 9:10 PM  Performed by: Roddie Grate, CRNAPre-anesthesia Checklist: Patient identified, Emergency Drugs available, Suction available, Patient being monitored and Timeout performed Patient Re-evaluated:Patient Re-evaluated prior to induction Oxygen Delivery Method: Circle system utilized Preoxygenation: Pre-oxygenation with 100% oxygen Induction Type: IV induction Laryngoscope Size: Glidescope and 4 Grade View: Grade I Tube type: Oral Tube size: 8.0 mm Number of attempts: 1 Airway Equipment and Method: Stylet Placement Confirmation: ETT inserted through vocal cords under direct vision, positive ETCO2 and breath sounds checked- equal and bilateral Secured at: 23 cm Tube secured with: Tape Dental Injury: Teeth and Oropharynx as per pre-operative assessment

## 2023-11-26 NOTE — Progress Notes (Signed)
 Patient transported to CT and back to 2M11 without complication.

## 2023-11-26 NOTE — Transfer of Care (Signed)
 Immediate Anesthesia Transfer of Care Note  Patient: Angel Scott  Procedure(s) Performed: SURGICAL PROCUREMENT, ORGAN  Organ Procurement

## 2023-11-26 NOTE — Progress Notes (Signed)
 Palliative:  HPI: 38 y.o. male  with past medical history of opiate abuse admitted on 11/13/2023 with cardiac arrest with ROSC 29 minutes after CPR initiated but with unknown downtime.   I met today at [facility] bedside. Reviewed with RN - no changes. I met with father, Angel Scott, outside room. Kreig's oldest daughter is visiting with her mother at bedside - we allowed them privacy for their visit. I spent time discussing with Angel Scott how he is doing. He shares that they have had some good visits with good supportive friends. They were able to determine a plan for how to honor Brigham and have plans for August 3rd. He feels good that they have a solid plan in place to gather and have closure together as a family. He shares more about [place] and how he has always been the [employer] - he feels that donation is aligned with the right way to honor Dee and they find comfort in knowing this is underway. We reviewed the struggles of getting through the evening but how they will continue to get each other through this together. Angel Scott is happy his daughter has such a supportive community in [place]. It hurts him to see his wife, Charlies, hurting and not being able to fix this. I encouraged him to continue to do as he always has and just be there for her through this and they will hurt and grieve together.   I attempted to connect with Renee to discuss but missed her on multiple occasions.   All questions/concerns addressed to the best of my ability. Emotional support provided.   Exam: Unresponsive. Tolerating vent support. Abd soft. No distress.   Plan: - DNR - Plans for extubation this evening  40 min  Bernarda Kitty, NP Palliative Medicine Team Pager [phone] (Please see [email or web address] for schedule) Team Phone [phone]

## 2023-11-26 NOTE — Progress Notes (Signed)
 Angel Scott will head to OR shortly for organ donation. I have placed orders for comfort care. New code status: DNR-Comfort.  Lonni Africa, DO

## 2023-11-26 NOTE — Progress Notes (Signed)
 NAME:  Angel Scott, MRN:  [id number], DOB:  [birth year], LOS: 4 ADMISSION DATE:  11/13/2023, CONSULTATION DATE:  7/19 REFERRING MD:  Angel Scott, CHIEF COMPLAINT:  cardiac arrest   History of Present Illness:  Angel Scott is a 38 year old gentleman with a history of opiate abuse who presented to the hospital with a cardiac arrest after being found down in a bathroom by his girlfriend. He had been down an unknown amount of time. When the emergency responders arrived he was apneic, pulseless, asystole. CPR was initiated at that time. He received 4 doses of epinephrine  and 2 doses of Narcan in the field. ROSC was achieved 29 minutes after the start of chest compressions. An IO and 600 endotracheal tube were placed in the field. Upon arrival to the ER he was not taking any spontaneous respirations, relying on bag-valve-mask. He was connected to mechanical ventilation. He was initially hypertensive but subsequently required vasopressors-norepinephrine  and vasopressin  have been started. PCCM consulted for admission.   Pertinent  Medical History  Opiate abuse HCV ADHD GAD  Significant Hospital Events: Including procedures, antibiotic start and stop dates in addition to other pertinent events   7/19 cardiac arrest, admitted  7/21 Discussion with family, code status DNR with eventual plans for comfort care and organ donation, but awaiting arrival of pts brother from [place]   Interim History / Subjective:  Unresponsive. No acute events overnight.   Objective    Blood pressure 117/80, pulse (!) 109, temperature (!) 97.5 F (36.4 C), resp. rate (!) 24, height 6' 4 (1.93 m), weight 89.7 kg, SpO2 96%.    Vent Mode: PRVC FiO2 (%):  [60 %-100 %] 60 % Set Rate:  [24 bmp] 24 bmp Vt Set:  [550 mL-660 mL] 550 mL PEEP:  [5 cmH20-8 cmH20] 8 cmH20 Plateau Pressure:  [14 cmH20-15 cmH20] 14 cmH20   Intake/Output Summary (Last 24 hours) at December 15, 2023 1030 Last data filed at 2023-12-15 1000 Gross per 24  hour  Intake 497.57 ml  Output 3085 ml  Net -2587.43 ml   Filed Weights   11/15/23 0351 11/16/23 0253 Dec 15, 2023 0526  Weight: 92.2 kg 92.2 kg 89.7 kg    Examination: Gen:        No acute distress HEENT:  EOMI, sclera anicteric, ETT Neck:     No masses; no thyromegaly Lungs:    Clear to auscultation bilaterally; normal respiratory effort CV:         Regular rate and rhythm; no murmurs Abd:        + bowel sounds; soft, non-tender; no palpable masses, no distension Ext:          No edema; adequate peripheral perfusion Neuro: Comatose.  No pupillary gag cough or corneal reflex.  Has some spontaneous breaths, but less than yesterday.  Resolved problem list   Assessment and Plan   Goals of Care See Monday IPAL note on discussion with family. Terminal prognosis and suspect brain death is approaching given imaging, areflexia, lack of sedatives. Currently DNR with plans for organ donation this evening, OR scheduled 5pm.  - initial and q6h lab checks underway per organ donation guidelines   Cardiac arrest Suspect opiate, amphetamine overdose as the precipitating cause.  Unknown downtime with initial rhythm of asystole.  30 minutes of CPR before ROSC. Lactic acidosis due to malperfusion Troponin elevation due to chest compressions and cardiac arrest Continue supportive care   Severe anoxic brain injury Per CT, loss of sulci/basilar cisterns and concern of impending tonsillar herniation. Off sedation. Though  brainstem reflexes are absent he has some spontaneous breaths still on assessment today. Lack of spontaneous breathing would prompt testing for brain death.   Shock, likely cardiogenic and distributive - resolved Vasopressors weaned overnight. Keep MAP goal > 65.  Stop stress dose steroids On empiric ceftriaxone .   Acute respiratory failure with hypoxia and hypercapnia requiring mechanical ventilation. Secondary to above. Continue full vent support.  No plans on extubation Follow  intermittent chest x-ray   AKI due to cardiac arrest, suspect ATN Recheck electrolytes and replete phosphate Monitor urine output and creatinine - Will add free water   Hypokalemia Replete via IV   Shock liver Monitor LFTs   Hyperglycemia SSI coverage   Possible urinary tract infection Ceftriaxone   Best Practice (right click and Reselect all SmartList Selections daily)   Diet/type: tubefeeds DVT prophylaxis: SCDs Pressure ulcer(s): Defer to RN assessment GI prophylaxis: PPI Lines: Central line and Arterial Line Foley:  Yes, and it is still needed Code Status:  DNR; no family at bedside Last date of multidisciplinary goals of care discussion [7/21 - terminal prognosis, pt under process of organ donation]  Labs   CBC: Recent Labs  Lab 11/13/23 1645 11/13/23 1700 11/16/23 0839 11/16/23 0847 11/16/23 1444 11/16/23 1559 11/16/23 2005 11/16/23 2044 11/16/23 2350 11/19/23 0148 Nov 19, 2023 0424 11-19-2023 0828 2023/11/19 0857  WBC 11.1*   < > 18.2*  --  20.2*  --  18.3*  --   --  16.3*  --   --  12.5*  NEUTROABS 7.4  --   --   --   --   --   --   --   --   --   --   --   --   HGB 12.3*   < > 11.6*   < > 11.8*   < > 11.7*   < > 10.2* 11.4* 10.5* 10.9* 11.4*  HCT 38.6*   < > 34.7*   < > 35.2*   < > 34.7*   < > 30.0* 34.0* 31.0* 32.0* 34.3*  MCV 103.2*   < > 97.5  --  96.7  --  96.4  --   --  97.7  --   --  98.0  PLT 210   < > 112*  --  121*  --  119*  --   --  113*  --   --  112*   < > = values in this interval not displayed.   Basic Metabolic Panel: Recent Labs  Lab 11/16/23 0839 11/16/23 0847 11/16/23 1444 11/16/23 1559 11/16/23 2005 11/16/23 2044 November 19, 2023 0148 19-Nov-2023 0305 11/19/2023 0424 19-Nov-2023 0828 11-19-2023 0857  NA 139   < > 145   < > 147*   < > 151* 151* 152* 154* 153*  K 3.5   < > 3.5   < > 3.3*   < > 3.1* 3.1* 3.1* 3.3* 3.3*  CL 104  --  108  --  112*  --  117* 117*  --   --  117*  CO2 20*  --  19*  --  19*  --  21* 21*  --   --  22  GLUCOSE 144*  --   164*  --  159*  --  135* 136*  --   --  135*  BUN 59*  --  60*  --  61*  --  62* 61*  --   --  65*  CREATININE 3.33*  --  3.16*  --  2.97*  --  2.98* 2.94*  --   --  3.10*  CALCIUM  7.9*  --  8.1*  --  8.3*  --  8.5* 8.3*  --   --  8.7*  MG 2.3  --  2.6*  --  2.8*  --  3.0*  --   --   --  3.2*  PHOS 4.4  --  2.7  --  2.7  --  2.3*  --   --   --  1.6*   < > = values in this interval not displayed.   GFR: Estimated Creatinine Clearance: 38.9 mL/min (A) (by C-G formula based on SCr of 3.1 mg/dL (H)). Recent Labs  Lab 11/16/23 1444 11/16/23 2005 11/26/2023 0148 2023-11-26 0857 11/26/2023 0859  WBC 20.2* 18.3* 16.3* 12.5*  --   LATICACIDVEN 1.1 1.0 1.0  --  1.4   Liver Function Tests: Recent Labs  Lab 11/16/23 0839 11/16/23 1444 11/16/23 2005 2023/11/26 0148 2023-11-26 0857  AST 46* 43* 39 34 32  ALT 61* 60* 55* 52* 48*  ALKPHOS 55 70 67 91 67  BILITOT 1.0 1.0 1.0 0.8 0.8  PROT 5.7* 6.2* 6.4* 6.4* 6.4*  ALBUMIN  2.5* 2.6* 2.6* 2.5* 2.4*   Recent Labs  Lab 11/16/23 1444  LIPASE 39  AMYLASE 404*   No results for input(s): AMMONIA in the last 168 hours.  ABG    Component Value Date/Time   PHART 7.436 November 26, 2023 0828   PCO2ART 35.3 November 26, 2023 0828   PO2ART 73 (L) Nov 26, 2023 0828   HCO3 23.9 26-Nov-2023 0828   TCO2 25 26-Nov-2023 0828   ACIDBASEDEF 2.0 11/16/2023 2350   O2SAT 95 2023/11/26 0828    Coagulation Profile: Recent Labs  Lab 11/16/23 0839 11/16/23 1444 11/16/23 2005 26-Nov-2023 0148 11-26-2023 0857  INR 1.0 1.0 1.1 1.1 1.1   Cardiac Enzymes: Recent Labs  Lab 11/15/23 1529 11/16/23 0237 11/16/23 1444 Nov 26, 2023 0148 November 26, 2023 0305  CKTOTAL 825* 550* 526* 400* 372  CKMB 21.6* 14.0* 14.4* 13.0* 7.6*   HbA1C: Hgb A1c MFr Bld  Date/Time Value Ref Range Status  11/13/2023 08:55 PM 5.1 4.8 - 5.6 % Final    Comment:    (NOTE) Diagnosis of Diabetes The following HbA1c ranges recommended by the American Diabetes Association (ADA) may be used as an aid in the  diagnosis of diabetes mellitus.  Hemoglobin             Suggested A1C NGSP%              Diagnosis  <5.7                   Non Diabetic  5.7-6.4                Pre-Diabetic  >6.4                   Diabetic  <7.0                   Glycemic control for                       adults with diabetes.     CBG: Recent Labs  Lab 11/16/23 1113 11/16/23 1720 11/16/23 1939 11/16/23 2311 11-26-2023 0317  GLUCAP 147* 169* 158* 131* 129*   Review of Systems:   Pt in vegetative state  Past Medical History:  He,  has a past medical history of ADHD, GAD (generalized anxiety disorder), Hepatitis C, Opioid dependence on agonist therapy (  HCC), and Opioid use disorder.   Surgical History:  History reviewed. No pertinent surgical history.   Social History:   reports that he has never smoked. He has never used smokeless tobacco. He reports that he does not drink alcohol  and does not use drugs.   Family History:  His family history is not on file.   Allergies Allergies  Allergen Reactions   Narcan [Naloxone] Dermatitis    Home Medications  Prior to Admission medications   Not on File    Critical care time: 35 mins    Lonni Africa, DO IM Resident PGY-2

## 2023-11-26 NOTE — Progress Notes (Signed)
 Pt pronounced time of death was 2100 hrs on this date of 2023/12/16. Pt was an organ donation pt so we did the protocol of auscultating for heart sounds for a  full minute followed by another auscultation after 4  minutes . No heart sounds were heard and no BP , HR,  aline or  pulse ox on the monitor. Angel Scott was the second RN who helped me with this. Confirmation.

## 2023-11-26 NOTE — Progress Notes (Signed)
 Pt is planned for organ donation in OR this evening. Per discussion with [facility], need a STAT CTA coronary with contrast for donation of heart. Discussed with cardiology to facilitate this study. Order placed and patient can proceed, and it is okay to give contrast despite current renal function.  Lonni Africa, DO

## 2023-11-26 NOTE — OR Nursing (Addendum)
 Time of death 2100 Cross clamp time:2204 Heart removed 2220 Liver removed 2247 Right kidney removed 2252

## 2023-11-26 NOTE — Anesthesia Preprocedure Evaluation (Addendum)
 Anesthesia Evaluation  Patient identified by MRN, date of birth, ID band Patient unresponsive    Reviewed: Allergy & Precautions, NPO status , Patient's Chart, lab work & pertinent test results, Unable to perform ROS - Chart review only  Airway Mallampati: Intubated       Dental  (+) Teeth Intact   Pulmonary  Intubated: RUL consolidation on CT   + rhonchi        Cardiovascular  Rhythm:Regular Rate:Normal  S/p cardiac arrest: cardiac arrest after being found down in a bathroom by his girlfriend. He had been down an unknown amount of time. When the emergency responders arrived he was apneic, pulseless, asystole. CPR was initiated at that time. He received 4 doses of epinephrine  and 2 doses of Narcan in the field. ROSC was achieved 29 minutes after the start of chest compressions. An IO and 600 endotracheal tube were placed in the field. Upon arrival to the ER he was not taking any spontaneous respirations, relying on bag-valve-mask. He was connected to mechanical ventilation.   11/16/2023 ECHO: EF 70 to 75%.  1. The LV has hyperdynamic function, no regional wall motion abnormalities. Left ventricular diastolic parameters were normal.   2. RVF is hyperdynamic. The right ventricular size is normal. There is normal pulmonary artery systolic pressure.   3. The mitral valve is normal in structure. No evidence of mitral valve regurgitation.   4. The aortic valve was not well visualized. Aortic valve regurgitation is not visualized. No aortic stenosis is present.     Neuro/Psych  PSYCHIATRIC DISORDERS (ADHD) Anxiety Depression    Anoxic brain injury    GI/Hepatic ,,,(+)     substance abuse  , Hepatitis -, C  Endo/Other  Glu 119  Renal/GU Renal Insufficiency and ARFRenal disease     Musculoskeletal  (+)  narcotic dependent  Abdominal   Peds  Hematology  (+) Blood dyscrasia (Hb 9.2, plt 118k), anemia   Anesthesia Other  Findings  38 year old with history of substance abuse presenting after out-of-hospital cardiac arrest with prolonged downtime.  Has severe anoxic injury with poor neurologic examination.  He has been assessed for organ donation Remains on the ventilator. Remains off pressors   Reproductive/Obstetrics                              Anesthesia Physical Anesthesia Plan  ASA: 6  Anesthesia Plan: MAC   Post-op Pain Management: Minimal or no pain anticipated   Induction:   PONV Risk Score and Plan: Treatment may vary due to age or medical condition  Airway Management Planned: Oral ETT  Additional Equipment: Arterial line  Intra-op Plan:   Post-operative Plan:   Informed Consent:   Plan Discussed with:   Anesthesia Plan Comments: (Consent by [facility] team)         Anesthesia Quick Evaluation

## 2023-11-26 NOTE — Anesthesia Postprocedure Evaluation (Addendum)
 Anesthesia Post Note  Patient: Angel Scott  Procedure(s) Performed: SURGICAL PROCUREMENT, ORGAN     Patient location during evaluation: Other Anesthesia Type: MAC Anesthetic complications: no Comments: Patient is deceased   Pt is deceased             Kailia Starry,E. Mckay Brandt

## 2023-11-26 NOTE — Progress Notes (Incomplete)
 Attending note: I have seen and examined the patient. History, labs and imaging reviewed.  37 year old with history of substance abuse presenting after out-of-hospital cardiac arrest with prolonged downtime.  Has severe anoxic injury with poor neurologic examination.  He has been assessed for organ donation Remains on the ventilator. Remains off pressors No events overnight.  Blood pressure 125/64, pulse (!) 101, temperature (!) 97.5 F (36.4 C), resp. rate (!) 24, height 6' 4 (1.93 m), weight 89.7 kg, SpO2 100%. Gen:      No acute distress HEENT:  EOMI, sclera anicteric, ETT Neck:     No masses; no thyromegaly Lungs:    Clear to auscultation bilaterally; normal respiratory effort*** CV:         Regular rate and rhythm; no murmurs Abd:      + bowel sounds; soft, non-tender; no palpable masses, no distension Ext:    No edema; adequate peripheral perfusion Neuro: Comatose, unresponsive.  Labs/Imaging personally reviewed, significant for Sodium 153, BUN/creatinine 65/2.10 WBC 12.5, hemoglobin 11.4, platelet 212  Bronchoscopy cultures growing MRSA  Assessment/plan: Cardiac arrest secondary to substance abuse Cardiomyopathy-improved Anoxic brain injury Continue supportive care.  Off pressors and stress dose steroids Replete lytes.Start D5W at 100cc/hr Does not have brainstem reflexes but continues to have some spontaneous breaths today   Acute respiratory failure Lower lobe pneumonia with MRSA on BAL On Zyvox  and cefepime  Continue vent support   Ongoing assessment for organ donation. Plan for OR trip for organ donation at 5pm today Appreciate palliative care helping family deal with this unfortunate situation.   The patient is critically ill with multiple organ systems failure and requires high complexity decision making for assessment and support, frequent evaluation and titration of therapies, application of advanced monitoring technologies and extensive interpretation of  multiple databases.  Critical care time - 35 mins. This represents my time independent of the NPs time taking care of the pt.  Conni Knighton MD [facility] December 13, 2023, 8:39 AM

## 2023-11-26 NOTE — Progress Notes (Addendum)
 Pt organ donation. Heart sounds auscultated no heart sounds noted. Asystole on heart monitor. Pt pronounced by Bobbette Maclachlan, RN and Oneil Dies. Pt expired 2023-12-15 at 2100

## 2023-11-26 NOTE — OR Nursing (Signed)
 Left Kidney removed 2259

## 2023-11-26 NOTE — Death Summary Note (Addendum)
 Death Summary  Name: Angel Scott MRN: [id number] DOB: 06-01-1985 38 y.o.  Date of Admission: 11/13/2023  4:12 PM Date of Discharge: Nov 24, 2023 Attending Physician: Dr. Calley Drenning  Discharge Diagnosis: Principal Problem:   Cardiac arrest [facility]) Severe anoxic brain injury Acute kidney injury Acute hypoxic respiratory failure due to pneumonia Sepsis present on admission secondary to MRSA pneumonia DNR, comfort care Palliative care  Cause of death: Cardiac Arrest Time of death: December 05, 2098  Disposition and follow-up:   [name] was discharged from [facility] in expired condition.    Hospital Course: Mr [name] was admitted to [facility] on 11/13/23 after an out-of-hospital cardiac arrest. He was discovered pulseless and apneic in his home bathroom. Cause of arrest is suspected opiate overdose. ROSC was achieved in the field after 29 minutes of ACLS with unknown total downtime. Respiratory support was via bag mask until intubation at [facility]. Post arrest he exhibited shock, acute respiratory failure requiring mechanical ventilation, acute kidney injury, shock liver, and encephalopathy for which he received treatment. He thereafter exhibited no meaningful neurologic recovery with CT findings suggestive of severe anoxic brain injury. On 11/15/23 his family opted to pursue organ donation with the understanding that his condition was terminal. He was subsequently supported through 11-24-23 while being assessed for organ donation. Thereafter, no brainstem reflexes could be elicited although he retained a hypoactive respiratory drive. He was transitioned to comfort measures on the evening of 11-24-23 with time of death 05-Dec-2098 and subsequent organ donation.   SignedBETHA Harrie Bruckner, DO 11/18/2023, 1:59 PM

## 2023-11-26 NOTE — Progress Notes (Signed)
 Recruitment of 12 of peep performed for 12 sec per donor services. Patient BP dropped by at least 10 during. Patient placed back on 8 of peep and 100% to carry out O2 challenge. ABG will be obtained at 1154.

## 2023-11-26 NOTE — OR Nursing (Incomplete Revision)
 Time of death 2100 Cross clamp time:2204 Heart removed 2220 Liver removed 2247 Right kidney removed 2252 Left kidney removed 2259

## 2023-11-26 NOTE — Plan of Care (Signed)
  Problem: Education: Goal: Ability to describe self-care measures that may prevent or decrease complications (Diabetes Survival Skills Education) will improve Outcome: Progressing   Problem: Fluid Volume: Goal: Ability to maintain a balanced intake and output will improve Outcome: Progressing   Problem: Metabolic: Goal: Ability to maintain appropriate glucose levels will improve Outcome: Progressing   Problem: Skin Integrity: Goal: Risk for impaired skin integrity will decrease Outcome: Progressing   Problem: Tissue Perfusion: Goal: Adequacy of tissue perfusion will improve Outcome: Progressing   Problem: Clinical Measurements: Goal: Will remain free from infection Outcome: Progressing   Problem: Clinical Measurements: Goal: Diagnostic test results will improve Outcome: Progressing   Problem: Safety: Goal: Ability to remain free from injury will improve Outcome: Progressing   Problem: Skin Integrity: Goal: Risk for impaired skin integrity will decrease Outcome: Progressing   Plan of care, assessment, monitoring, treatment, and intervention (s) ongoing, see MAR see flowsheet

## 2023-11-26 DEATH — deceased
